# Patient Record
Sex: Female | Born: 1995 | Race: Black or African American | Hispanic: No | Marital: Single | Smoking: Never smoker
Health system: Southern US, Community
[De-identification: ages and names within clinical notes are randomized; demographics above are authoritative.]

## PROBLEM LIST (undated history)

## (undated) ENCOUNTER — Inpatient Hospital Stay (HOSPITAL_COMMUNITY): Payer: Self-pay

## (undated) DIAGNOSIS — O139 Gestational [pregnancy-induced] hypertension without significant proteinuria, unspecified trimester: Secondary | ICD-10-CM

---

## 2018-05-05 LAB — OB RESULTS CONSOLE GC/CHLAMYDIA
Chlamydia: NEGATIVE
Gonorrhea: NEGATIVE

## 2018-05-05 LAB — CYTOLOGY - PAP: PAP SMEAR: NEGATIVE

## 2018-05-23 LAB — OB RESULTS CONSOLE HEPATITIS B SURFACE ANTIGEN: HEP B S AG: NEGATIVE

## 2018-05-23 LAB — OB RESULTS CONSOLE RUBELLA ANTIBODY, IGM: RUBELLA: IMMUNE

## 2018-05-23 LAB — OB RESULTS CONSOLE HGB/HCT, BLOOD
HEMATOCRIT: 29
Hemoglobin: 9

## 2018-05-23 LAB — OB RESULTS CONSOLE ABO/RH: RH Type: POSITIVE

## 2018-05-23 LAB — OB RESULTS CONSOLE RPR: RPR: NONREACTIVE

## 2018-05-23 LAB — OB RESULTS CONSOLE PLATELET COUNT: Platelets: 435

## 2018-05-23 LAB — OB RESULTS CONSOLE ANTIBODY SCREEN: Antibody Screen: NEGATIVE

## 2018-05-23 LAB — CULTURE, OB URINE: Urine Culture, OB: POSITIVE

## 2018-05-23 LAB — OB RESULTS CONSOLE HIV ANTIBODY (ROUTINE TESTING): HIV: NONREACTIVE

## 2018-08-13 ENCOUNTER — Encounter: Payer: Self-pay | Admitting: *Deleted

## 2018-08-14 ENCOUNTER — Encounter: Payer: Self-pay | Admitting: *Deleted

## 2018-08-18 ENCOUNTER — Ambulatory Visit (INDEPENDENT_AMBULATORY_CARE_PROVIDER_SITE_OTHER): Payer: Medicaid Other | Admitting: Family Medicine

## 2018-08-18 ENCOUNTER — Ambulatory Visit: Payer: Medicaid Other | Admitting: Clinical

## 2018-08-18 ENCOUNTER — Encounter: Payer: Self-pay | Admitting: Family Medicine

## 2018-08-18 DIAGNOSIS — Z23 Encounter for immunization: Secondary | ICD-10-CM

## 2018-08-18 DIAGNOSIS — Z3401 Encounter for supervision of normal first pregnancy, first trimester: Secondary | ICD-10-CM | POA: Diagnosis not present

## 2018-08-18 DIAGNOSIS — Z3482 Encounter for supervision of other normal pregnancy, second trimester: Secondary | ICD-10-CM | POA: Diagnosis not present

## 2018-08-18 DIAGNOSIS — Z3403 Encounter for supervision of normal first pregnancy, third trimester: Secondary | ICD-10-CM | POA: Insufficient documentation

## 2018-08-18 MED ORDER — CONCEPT OB 130-92.4-1 MG PO CAPS: 1.0000 | ORAL_CAPSULE | Freq: Every day | ORAL | 3 refills | Status: DC

## 2018-08-18 MED ORDER — FERROUS SULFATE 325 (65 FE) MG PO TABS: ORAL_TABLET | ORAL | 2 refills | Status: DC

## 2018-08-18 NOTE — Patient Instructions (Signed)

## 2018-08-18 NOTE — BH Specialist Note (Signed)
Integrated Behavioral Health Initial Visit  MRN: [id number] Name: Natalie Holmes  Number of Integrated Behavioral Health Clinician visits:: 1/6 Session Start time: 4:13  Session End time: 4:23 Total time: 15 minutes  Type of Service: Integrated Behavioral Health- Individual/Family Interpretor:No. Interpretor Name and Language: n/a   Warm Hand Off Completed.       SUBJECTIVE: Natalie Holmes is a 22 y.o. female accompanied by n/a Patient was referred by Tinnie Gens, MD for Initial OB introduction to integrated behavioral health services . Patient reports the following symptoms/concerns: Pt states being unfamiliar with hospital as only concern.  Duration of problem: Today; Severity of problem: mild  OBJECTIVE: Mood: Normal and Affect: Appropriate Risk of harm to self or others: No plan to harm self or others  LIFE CONTEXT: Family and Social: Pt lives with FOB School/Work: - Self-Care: - Life Changes: Current pregnancy; move to [place] from [facility] about one month prior  GOALS ADDRESSED:n/a  INTERVENTIONS: Standardized Assessments completed: GAD-7 and PHQ 9  ASSESSMENT: Patient currently experiencing Supervision of normal first pregnancy in first trimester   Patient may benefit from Initial OB introduction to integrated behavioral health services .  PLAN: 1. Follow up with behavioral health clinician on : As needed 2. Behavioral recommendations:  -Continue taking prenatal vitamin; begin taking iron, as prescribed by medical provider -Watch [facility] Tour; share with FOB 3. Referral(s): Integrated [employer] (In Clinic) 4. "From scale of 1-10, how likely are you to follow plan?": 10  Rae Lips, LCSW  Depression screen [facility] 2/9 08/18/2018  Decreased Interest 0  Down, Depressed, Hopeless 0  PHQ - 2 Score 0  Altered sleeping 2  Tired, decreased energy 1  Change in appetite 0  Feeling bad or failure about yourself  0  Trouble concentrating  0  Moving slowly or fidgety/restless 0  Suicidal thoughts 0  PHQ-9 Score 3   GAD 7 : Generalized Anxiety Score 08/18/2018  Nervous, Anxious, on Edge 0  Control/stop worrying 0  Worry too much - different things 0  Trouble relaxing 0  Restless 0  Easily annoyed or irritable 0  Afraid - awful might happen 0  Total GAD 7 Score 0

## 2018-08-18 NOTE — Progress Notes (Signed)
.     PRENATAL VISIT NOTE  Subjective:  Natalie Holmes is a 22 y.o. G1P0 at [redacted]w[redacted]d being seen today for transferring prenatal care.  She is currently monitored for the following issues for this low-risk pregnancy and has Encounter for supervision of normal first pregnancy in first trimester on their problem list.  Patient reports no complaints. States legs fall asleep when on toilet if leans over. Contractions: Not present. Vag. Bleeding: None.  Movement: Present. Denies leaking of fluid.   The following portions of the patient's history were reviewed and updated as appropriate: allergies, current medications, past family history, past medical history, past social history, past surgical history and problem list. Problem list updated.  Objective:   Vitals:   08/18/18 1521 08/18/18 1524  BP: 124/78   Pulse: (!) 108   Weight: 131 lb 6.4 oz (59.6 kg)   Height:  5\' 1"  (1.549 m)    Fetal Status: Fetal Heart Rate (bpm): 154 Fundal Height: 22 cm Movement: Present     General:  Alert, oriented and cooperative. Patient is in no acute distress.  Skin: Skin is warm and dry. No rash noted.   Cardiovascular: Normal heart rate noted  Respiratory: Normal respiratory effort, no problems with respiration noted  Abdomen: Soft, gravid, appropriate for gestational age.  Pain/Pressure: Absent     Pelvic: Cervical exam deferred        Extremities: Normal range of motion.  Edema: None  Mental Status: Normal mood and affect. Normal behavior. Normal judgment and thought content.   Assessment and Plan:  Pregnancy: G1P0 at [redacted]w[redacted]d  1. Encounter for supervision of normal first pregnancy in first trimester Desires NIPT, flu shot today Needs anatomy u/s 28 wk labs next visit.  - Flu Vaccine QUAD 36+ mos IM - [place] MFM OB COMP + 14 WK; Future - ferrous sulfate 325 (65 FE) MG tablet; ferrous sulfate 325 mg (65 mg iron) tablet  Take 1 tablet twice a day by oral route for 30 days.  Dispense: 180 tablet; Refill: 2 -  Prenat w/o A Vit-FeFum-FePo-FA (CONCEPT OB) 130-92.4-1 MG CAPS; Take 1 capsule by mouth daily.  Dispense: 90 capsule; Refill: 3 - Genetic Screening  General obstetric precautions including but not limited to vaginal bleeding, contractions, leaking of fluid and fetal movement were reviewed in detail with the patient. Please refer to After Visit Summary for other counseling recommendations.  Return in 4 weeks (on 09/15/2018) for 28 wk labs.  Future Appointments  Date Time Provider Department Center  08/28/2018  7:45 AM WH-MFC [place] 2 WH-MFCUS MFC-US  09/15/2018  8:50 AM WOC-WOCA LAB WOC-[facility]A WOC  09/15/2018  9:15 AM Allie Bossier, MD WOC-[facility] WOC    Reva Bores, MD

## 2018-08-19 ENCOUNTER — Encounter: Payer: Self-pay | Admitting: *Deleted

## 2018-08-28 ENCOUNTER — Other Ambulatory Visit (HOSPITAL_COMMUNITY): Payer: Self-pay | Admitting: *Deleted

## 2018-08-28 ENCOUNTER — Ambulatory Visit (HOSPITAL_COMMUNITY)
Admission: RE | Admit: 2018-08-28 | Discharge: 2018-08-28 | Disposition: A | Discharge status: Home / Self Care | Payer: Medicaid Other | Source: Ambulatory Visit | Attending: Family Medicine | Admitting: Family Medicine

## 2018-08-28 DIAGNOSIS — Z3A25 25 weeks gestation of pregnancy: Secondary | ICD-10-CM | POA: Insufficient documentation

## 2018-08-28 DIAGNOSIS — Z363 Encounter for antenatal screening for malformations: Secondary | ICD-10-CM | POA: Insufficient documentation

## 2018-08-28 DIAGNOSIS — Z3401 Encounter for supervision of normal first pregnancy, first trimester: Secondary | ICD-10-CM

## 2018-08-28 DIAGNOSIS — Z362 Encounter for other antenatal screening follow-up: Secondary | ICD-10-CM

## 2018-09-01 ENCOUNTER — Encounter: Payer: Self-pay | Admitting: *Deleted

## 2018-09-12 ENCOUNTER — Other Ambulatory Visit: Payer: Self-pay | Admitting: General Practice

## 2018-09-12 DIAGNOSIS — Z3401 Encounter for supervision of normal first pregnancy, first trimester: Secondary | ICD-10-CM

## 2018-09-15 ENCOUNTER — Ambulatory Visit (INDEPENDENT_AMBULATORY_CARE_PROVIDER_SITE_OTHER): Payer: Medicaid Other | Admitting: Family Medicine

## 2018-09-15 ENCOUNTER — Other Ambulatory Visit: Payer: Medicaid Other

## 2018-09-15 VITALS — BP 114/72 | HR 97 | Wt 137.9 lb

## 2018-09-15 DIAGNOSIS — Z3401 Encounter for supervision of normal first pregnancy, first trimester: Secondary | ICD-10-CM | POA: Diagnosis not present

## 2018-09-15 DIAGNOSIS — Z23 Encounter for immunization: Secondary | ICD-10-CM

## 2018-09-15 DIAGNOSIS — Z3403 Encounter for supervision of normal first pregnancy, third trimester: Secondary | ICD-10-CM

## 2018-09-15 MED ORDER — FERROUS SULFATE 325 (65 FE) MG PO TABS: 325.0000 mg | ORAL_TABLET | Freq: Every day | ORAL | 1 refills | Status: DC

## 2018-09-16 LAB — CBC
HEMATOCRIT: 38.9 % (ref 34.0–46.6)
Hemoglobin: 12.5 g/dL (ref 11.1–15.9)
MCH: 27 pg (ref 26.6–33.0)
MCHC: 32.1 g/dL (ref 31.5–35.7)
MCV: 84 fL (ref 79–97)
Platelets: 284 10*3/uL (ref 150–450)
RBC: 4.63 x10E6/uL (ref 3.77–5.28)
RDW: 13.8 % (ref 12.3–15.4)
WBC: 11.9 10*3/uL — ABNORMAL HIGH (ref 3.4–10.8)

## 2018-09-16 LAB — GLUCOSE TOLERANCE, 2 HOURS W/ 1HR
Glucose, 1 hour: 120 mg/dL (ref 65–179)
Glucose, 2 hour: 98 mg/dL (ref 65–152)
Glucose, Fasting: 75 mg/dL (ref 65–91)

## 2018-09-16 LAB — HIV ANTIBODY (ROUTINE TESTING W REFLEX): HIV Screen 4th Generation wRfx: NONREACTIVE

## 2018-09-16 LAB — RPR: RPR Ser Ql: NONREACTIVE

## 2018-09-16 NOTE — Progress Notes (Signed)
   PRENATAL VISIT NOTE  Subjective:  Natalie Holmes is a 22 y.o. G1P0 at 6070w1d being seen today for ongoing prenatal care.  She is currently monitored for the following issues for this low-risk pregnancy and has Encounter for supervision of normal first pregnancy in third trimester on their problem list.  Patient reports no complaints.  Contractions: Not present. Vag. Bleeding: None.  Movement: Present. Denies leaking of fluid.   The following portions of the patient's history were reviewed and updated as appropriate: allergies, current medications, past family history, past medical history, past social history, past surgical history and problem list. Problem list updated.  Objective:   Vitals:   09/15/18 0907  BP: 114/72  Pulse: 97  Weight: 137 lb 14.4 oz (62.6 kg)    Fetal Status: Fetal Heart Rate (bpm): 156   Movement: Present     General:  Alert, oriented and cooperative. Patient is in no acute distress.  Skin: Skin is warm and dry. No rash noted.   Cardiovascular: Normal heart rate noted  Respiratory: Normal respiratory effort, no problems with respiration noted  Abdomen: Soft, gravid, appropriate for gestational age.  Pain/Pressure: Absent     Pelvic: Cervical exam deferred        Extremities: Normal range of motion.  Edema: None  Mental Status: Normal mood and affect. Normal behavior. Normal judgment and thought content.   Assessment and Plan:  Pregnancy: G1P0 at 7870w1d  1. Need for Tdap vaccination - Tdap vaccine greater than or equal to 7yo IM  2. Encounter for supervision of normal first pregnancy in third trimester - ferrous sulfate 325 (65 FE) MG tablet; Take 1 tablet (325 mg total) by mouth daily. ferrous sulfate 325 mg (65 mg iron) tablet  Take 1 tablet twice a day by oral route for 30 days.  Dispense: 90 tablet; Refill: 1  Preterm labor symptoms and general obstetric precautions including but not limited to vaginal bleeding, contractions, leaking of fluid and fetal  movement were reviewed in detail with the patient. Please refer to After Visit Summary for other counseling recommendations.  Return in about 2 weeks (around 09/29/2018) for ROB.  Future Appointments  Date Time Provider Department Center  09/25/2018 10:45 AM WH-MFC US 2 WH-MFCUS MFC-US  09/29/2018  9:35 AM Armando ReichertHogan, Heather D, CNM WOC-WOCA WOC  10/13/2018 10:55 AM Armando ReichertHogan, Heather D, CNM WOC-WOCA WOC  10/27/2018  8:55 AM Armando ReichertHogan, Heather D, CNM WOC-WOCA WOC    Gwenevere AbbotNimeka Nike Southers, MD

## 2018-09-25 ENCOUNTER — Encounter (HOSPITAL_COMMUNITY): Payer: Self-pay

## 2018-09-25 ENCOUNTER — Ambulatory Visit (HOSPITAL_COMMUNITY)
Admission: RE | Admit: 2018-09-25 | Discharge: 2018-09-25 | Disposition: A | Discharge status: Home / Self Care | Payer: Medicaid Other | Source: Ambulatory Visit | Attending: Family Medicine | Admitting: Family Medicine

## 2018-09-25 DIAGNOSIS — Z3687 Encounter for antenatal screening for uncertain dates: Secondary | ICD-10-CM | POA: Diagnosis not present

## 2018-09-25 DIAGNOSIS — Z3A29 29 weeks gestation of pregnancy: Secondary | ICD-10-CM

## 2018-09-25 DIAGNOSIS — Z362 Encounter for other antenatal screening follow-up: Secondary | ICD-10-CM | POA: Diagnosis not present

## 2018-09-25 IMAGING — US US MFM OB FOLLOW-UP
1 series · 13 of 28 positions shown · non-contrast
Comparison: none

[Series 1: us mfm ob follow-up · 54 acquisitions, 13 frames shown]
[im 2/54]
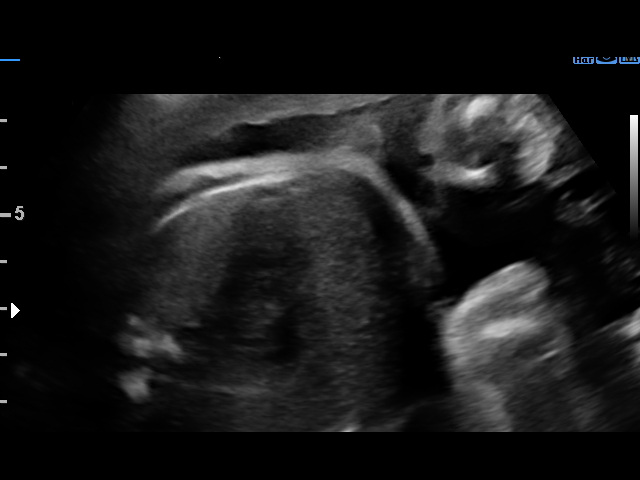
[im 6/54]
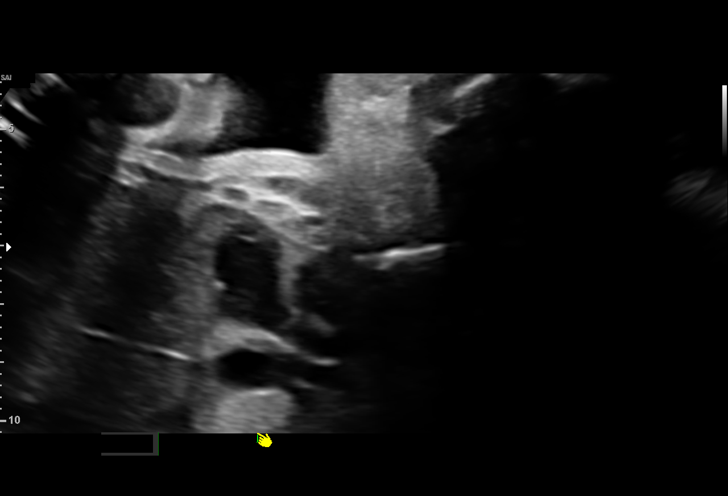
[im 10/54]
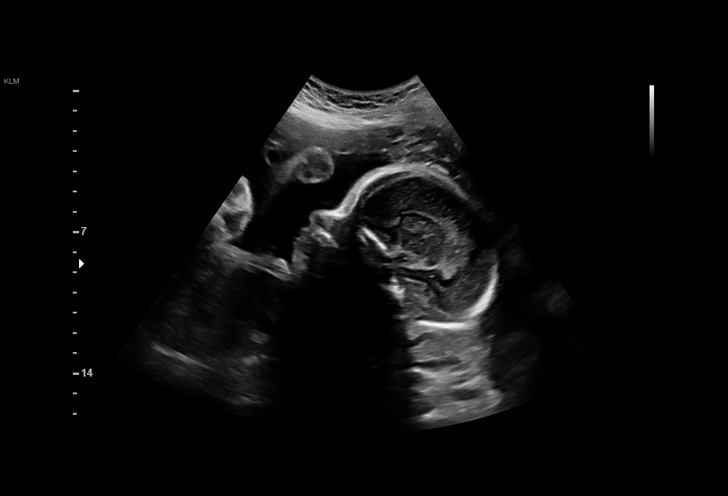
[im 14/54]
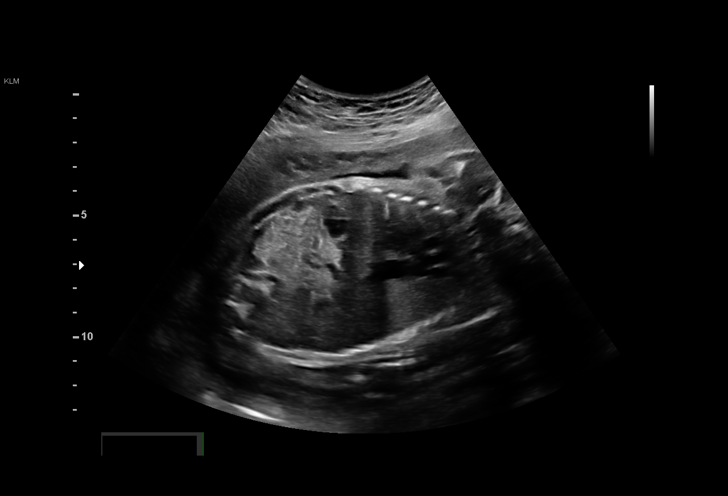
[im 18/54]
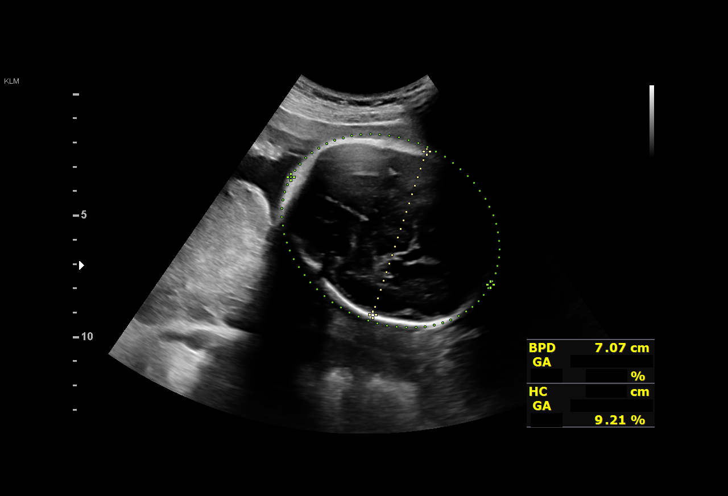
[im 22/54]
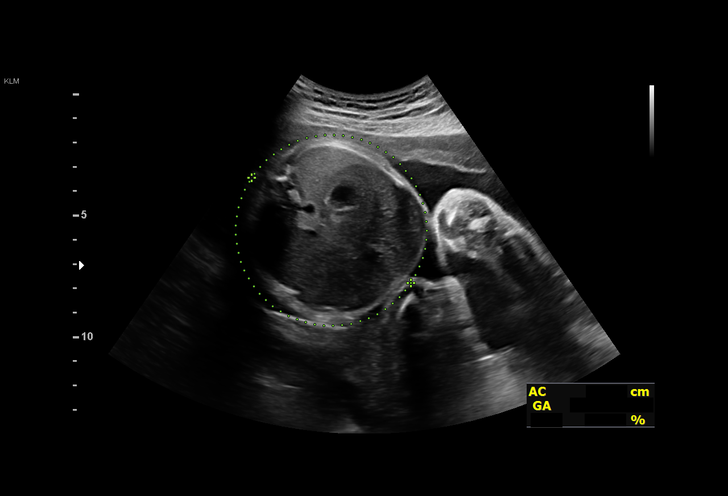
[im 28/54]
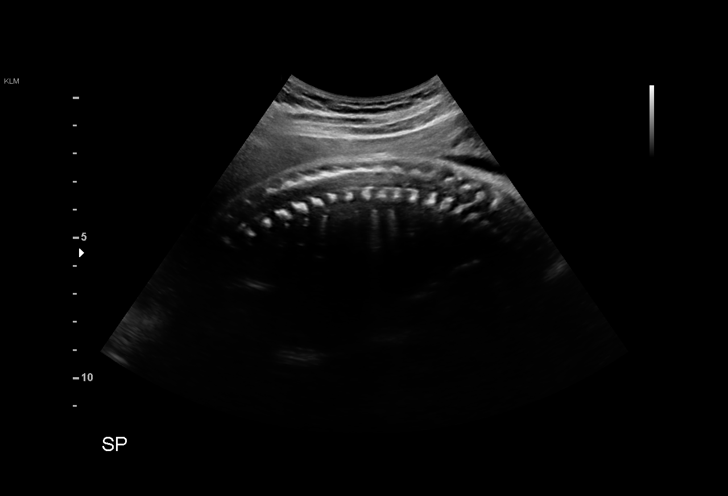
[im 32/54]
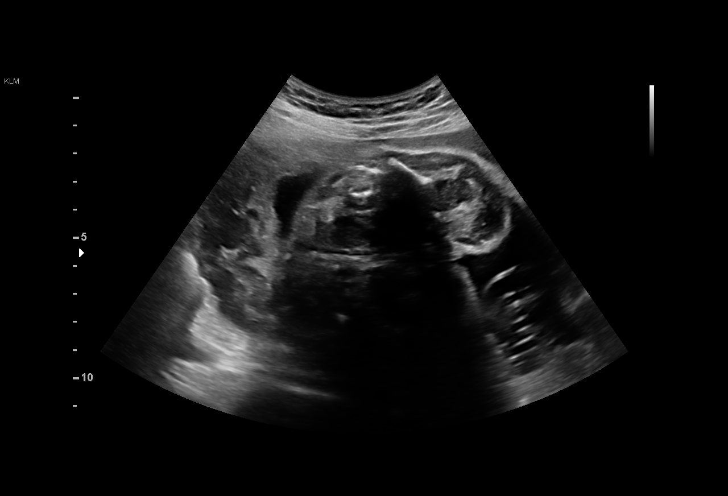
[im 36/54]
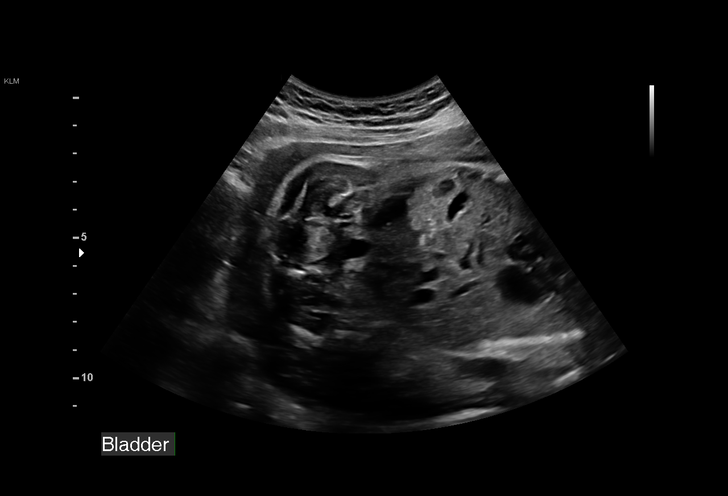
[im 40/54]
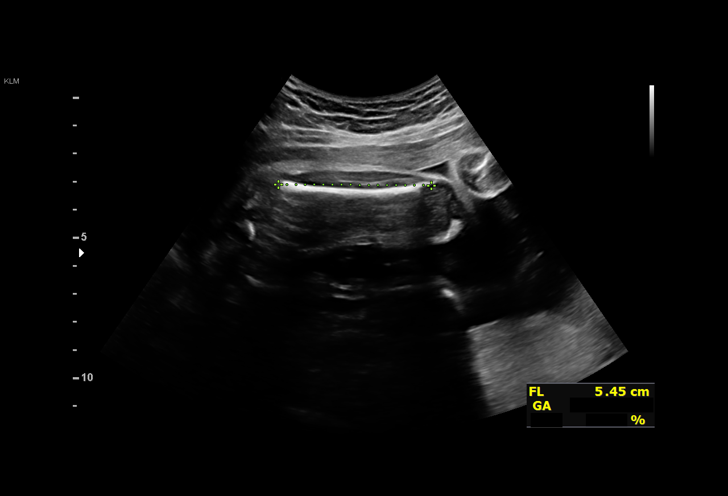
[im 44/54]
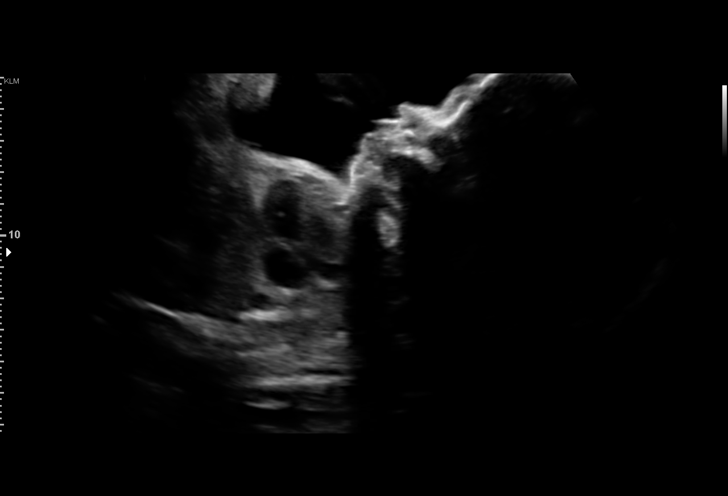
[im 48/54]
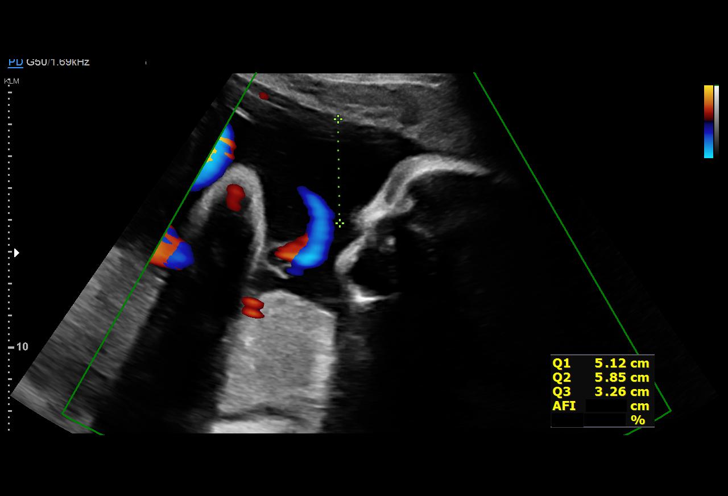
[im 52/54]
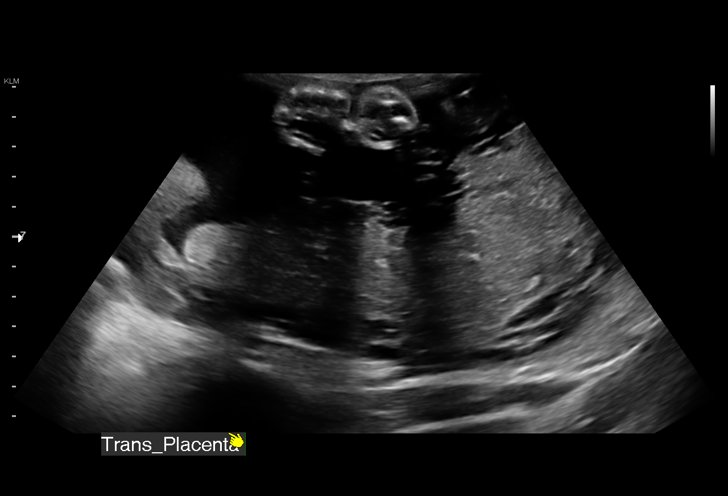

[13 of 28 positions shown; findings below may reference images not displayed]

----------------------------------------------------------------------

 ----------------------------------------------------------------------
Indications

  Antenatal follow-up for nonvisualized fetal    [KB]
  anatomy
  29 weeks gestation of pregnancy
  Encounter for uncertain dates                  [KB]
 ----------------------------------------------------------------------
Fetal Evaluation

 Num Of Fetuses:         1
 Fetal Heart Rate(bpm):  146
 Cardiac Activity:       Observed
 Presentation:           Cephalic
 Placenta:               Posterior
 P. Cord Insertion:      Visualized

 Amniotic Fluid
 AFI FV:      Within normal limits

 AFI Sum(cm)     %Tile       Largest Pocket(cm)
 17.67           66

 RUQ(cm)       RLQ(cm)       LUQ(cm)        LLQ(cm)

Biometry

 BPD:        71  mm     G. Age:  28w 4d         14  %    CI:        73.37   %    70 - 86
                                                         FL/HC:      20.7   %    19.6 -
 HC:      263.4  mm     G. Age:  28w 4d          6  %    HC/AC:      1.07        0.99 -
 AC:      246.7  mm     G. Age:  28w 6d         29  %    FL/BPD:     76.8   %    71 - 87
 FL:       54.5  mm     G. Age:  28w 6d         20  %    FL/AC:      22.1   %    20 - 24

 Est. FW:    [KB]  gm    2 lb 13 oz      40  %
OB History

 Gravidity:    1
Gestational Age

 LMP:           29w 3d        Date:  [DATE]                 EDD:   [DATE]
 U/S Today:     28w 5d                                        EDD:   [DATE]
 Best:          29w 3d     Det. By:  LMP  ([DATE])          EDD:   [DATE]
Anatomy

 Cranium:               Appears normal         Aortic Arch:            Appears normal
 Cavum:                 Appears normal         Ductal Arch:            Appears normal
 Ventricles:            Appears normal         Diaphragm:              Appears normal
 Choroid Plexus:        Appears normal         Stomach:                Appears normal, left
                                                                       sided
 Cerebellum:            Previously seen        Abdomen:                Appears normal
 Posterior Fossa:       Previously seen        Abdominal Wall:         Previously seen
 Nuchal Fold:           Not applicable (>20    Cord Vessels:           Previously seen
                        wks GA)
 Face:                  Orbits and profile     Kidneys:                Appear normal
                        previously seen
 Lips:                  Appears normal         Bladder:                Appears normal
 Thoracic:              Appears normal         Spine:                  Appears normal
 Heart:                 Echogenic focus        Upper Extremities:      Previously seen
                        in LV
 RVOT:                  Appears normal         Lower Extremities:      Previously seen
 LVOT:                  Appears normal

 Other:  Open hands previously visualized. Fetus appears to be female.
         Technically difficult due to fetal position.
Cervix Uterus Adnexa

 Cervix
 Not visualized (advanced GA >[KB])
Comments

 U/S images reviewed. Findings reviewed with patient.
 Appropriate fetal growth is noted.   An echogenic intra-
 cardiac focus was seen.  No other fetal abnormalities are
 seen.  Questions answered.
 General counseling was performed regarding echogenic
 intracardiac focus/foci (EIF) was performed.  EIF is seen in 3-
 4% of normal fetuses in the second trimester.  The
 prevalence appears to be significantly higher in Asian
 populations.  The likelihood ratio of EIF for Trisomy 21 is
 estimated in the range of 1.8 - 4.2 (times the age related
 risk).  Multiple or large EIF may further increase the risk of
 aneuploidy.   Patient's age related risk for Trisomy 21 with an
 EIF is 1/568.  Patient had a low risk NIPT reducing her risk to
 less than [DATE],000.

 15 minutes spent face to face with patient.
 Recommendations: 1) Follow-up as clinically indicated
Recommendations

 Follow-up as clinically indicated
              ESTONIA

## 2018-09-29 ENCOUNTER — Encounter (HOSPITAL_COMMUNITY): Payer: Self-pay

## 2018-09-29 ENCOUNTER — Encounter: Payer: Self-pay | Admitting: Advanced Practice Midwife

## 2018-09-29 ENCOUNTER — Other Ambulatory Visit: Payer: Self-pay

## 2018-09-29 ENCOUNTER — Ambulatory Visit (INDEPENDENT_AMBULATORY_CARE_PROVIDER_SITE_OTHER): Payer: Medicaid Other | Admitting: Advanced Practice Midwife

## 2018-09-29 ENCOUNTER — Inpatient Hospital Stay (HOSPITAL_COMMUNITY)
Admission: AD | Admit: 2018-09-29 | Discharge: 2018-09-29 | Disposition: A | Discharge status: Home / Self Care | Payer: Medicaid Other | Source: Ambulatory Visit | Attending: Obstetrics & Gynecology | Admitting: Obstetrics & Gynecology

## 2018-09-29 VITALS — BP 123/74 | HR 118 | Wt 142.0 lb

## 2018-09-29 DIAGNOSIS — O4693 Antepartum hemorrhage, unspecified, third trimester: Secondary | ICD-10-CM | POA: Diagnosis not present

## 2018-09-29 DIAGNOSIS — N930 Postcoital and contact bleeding: Secondary | ICD-10-CM

## 2018-09-29 DIAGNOSIS — O2313 Infections of bladder in pregnancy, third trimester: Secondary | ICD-10-CM | POA: Diagnosis not present

## 2018-09-29 DIAGNOSIS — B373 Candidiasis of vulva and vagina: Secondary | ICD-10-CM

## 2018-09-29 DIAGNOSIS — O98813 Other maternal infectious and parasitic diseases complicating pregnancy, third trimester: Secondary | ICD-10-CM

## 2018-09-29 DIAGNOSIS — Z79899 Other long term (current) drug therapy: Secondary | ICD-10-CM | POA: Insufficient documentation

## 2018-09-29 DIAGNOSIS — O26893 Other specified pregnancy related conditions, third trimester: Secondary | ICD-10-CM | POA: Diagnosis not present

## 2018-09-29 DIAGNOSIS — O98819 Other maternal infectious and parasitic diseases complicating pregnancy, unspecified trimester: Secondary | ICD-10-CM

## 2018-09-29 DIAGNOSIS — Z880 Allergy status to penicillin: Secondary | ICD-10-CM | POA: Diagnosis not present

## 2018-09-29 DIAGNOSIS — Z3A30 30 weeks gestation of pregnancy: Secondary | ICD-10-CM

## 2018-09-29 DIAGNOSIS — N3000 Acute cystitis without hematuria: Secondary | ICD-10-CM | POA: Diagnosis not present

## 2018-09-29 DIAGNOSIS — B9689 Other specified bacterial agents as the cause of diseases classified elsewhere: Secondary | ICD-10-CM | POA: Insufficient documentation

## 2018-09-29 DIAGNOSIS — Z3403 Encounter for supervision of normal first pregnancy, third trimester: Secondary | ICD-10-CM

## 2018-09-29 DIAGNOSIS — B3731 Acute candidiasis of vulva and vagina: Secondary | ICD-10-CM

## 2018-09-29 LAB — URINALYSIS, ROUTINE W REFLEX MICROSCOPIC
BILIRUBIN URINE: NEGATIVE
GLUCOSE, UA: NEGATIVE mg/dL
Ketones, ur: NEGATIVE mg/dL
NITRITE: POSITIVE — AB
PROTEIN: NEGATIVE mg/dL
Specific Gravity, Urine: 1.006 (ref 1.005–1.030)
pH: 6 (ref 5.0–8.0)

## 2018-09-29 MED ORDER — NITROFURANTOIN MONOHYD MACRO 100 MG PO CAPS: 100.0000 mg | ORAL_CAPSULE | Freq: Two times a day (BID) | ORAL | 0 refills | Status: DC

## 2018-09-29 MED ORDER — TERCONAZOLE 0.4 % VA CREA: 1.0000 | TOPICAL_CREAM | Freq: Every day | VAGINAL | 0 refills | Status: DC

## 2018-09-29 NOTE — MAU Provider Note (Signed)
History     CSN: [id number]  Arrival date and time: 09/29/18 7253   First Provider Initiated Contact with Patient 09/29/18 0548      Chief Complaint  Patient presents with  . Vaginal Bleeding   HPI Natalie Holmes is a 22 y.o. G1P0 at [redacted]w[redacted]d who presents with vaginal bleeding after intercourse tonight. She states the bleeding continued longer than she expected and it more than just spotting. She denies any pain. Denies any leaking of fluid. Reports normal fetal movement.   OB History    Gravida  1   Para      Term      Preterm      AB      Living        SAB      TAB      Ectopic      Multiple      Live Births              History reviewed. No pertinent past medical history.  History reviewed. No pertinent surgical history.  Family History  Problem Relation Age of Onset  . Healthy Mother   . Healthy Father     Social History   Tobacco Use  . Smoking status: Never Smoker  . Smokeless tobacco: Never Used  Substance Use Topics  . Alcohol use: Not Currently    Alcohol/week: 1.0 standard drinks    Types: 1 Glasses of wine per week    Frequency: Never  . Drug use: Never    Allergies:  Allergies  Allergen Reactions  . Penicillins Hives    Medications Prior to Admission  Medication Sig Dispense Refill Last Dose  . ferrous sulfate 325 (65 FE) MG tablet Take 1 tablet (325 mg total) by mouth daily. ferrous sulfate 325 mg (65 mg iron) tablet  Take 1 tablet twice a day by oral route for 30 days. 90 tablet 1 Past Month at Unknown time  . Prenat w/o A Vit-FeFum-FePo-FA (CONCEPT OB) 130-92.4-1 MG CAPS Take 1 capsule by mouth daily. 90 capsule 3 09/28/2018 at Unknown time    Review of Systems  Constitutional: Negative.  Negative for fatigue and fever.  HENT: Negative.   Respiratory: Negative.  Negative for shortness of breath.   Cardiovascular: Negative.  Negative for chest pain.  Gastrointestinal: Negative.  Negative for abdominal pain, constipation,  diarrhea, nausea and vomiting.  Genitourinary: Positive for vaginal bleeding. Negative for dysuria.  Neurological: Negative.  Negative for dizziness and headaches.   Physical Exam   Last menstrual period 03/03/2018.  Physical Exam  Nursing note and vitals reviewed. Constitutional: She is oriented to person, place, and time. She appears well-developed and well-nourished. No distress.  HENT:  Head: Normocephalic.  Eyes: Pupils are equal, round, and reactive to light.  Cardiovascular: Normal rate, regular rhythm and normal heart sounds.  Respiratory: Effort normal and breath sounds normal. No respiratory distress.  GI: Soft. Bowel sounds are normal. She exhibits no distension. There is no tenderness.  Genitourinary:  Genitourinary Comments: No blood in vault. Small amount of brown discharge and thick white adherent discharge on vaginal walls. No blood noted   Neurological: She is alert and oriented to person, place, and time.  Skin: Skin is warm and dry.  Psychiatric: She has a normal mood and affect. Her behavior is normal. Judgment and thought content normal.   Dilation: Closed Exam by:: Cleone Slim, CNM  Fetal Tracing:  Baseline: 130 Variability: moderate Accels: 15x15 Decels: none  Toco:  ui with occasional uc's   MAU Course  Procedures Results for orders placed or performed during the hospital encounter of 09/29/18 (from the past 24 hour(s))  Urinalysis, Routine w reflex microscopic     Status: Abnormal   Collection Time: 09/29/18  5:23 AM  Result Value Ref Range   Color, Urine YELLOW YELLOW   APPearance CLOUDY (A) CLEAR   Specific Gravity, Urine 1.006 1.005 - 1.030   pH 6.0 5.0 - 8.0   Glucose, UA NEGATIVE NEGATIVE mg/dL   Hgb urine dipstick LARGE (A) NEGATIVE   Bilirubin Urine NEGATIVE NEGATIVE   Ketones, ur NEGATIVE NEGATIVE mg/dL   Protein, ur NEGATIVE NEGATIVE mg/dL   Nitrite POSITIVE (A) NEGATIVE   Leukocytes, UA LARGE (A) NEGATIVE   RBC / HPF 21-50 0 -  5 RBC/hpf   WBC, UA >50 (H) 0 - 5 WBC/hpf   Bacteria, UA MANY (A) NONE SEEN   Squamous Epithelial / LPF 11-20 0 - 5   Mucus PRESENT    MDM UA PO hydration Unable to do FFN due to recent intercourse Patient denies any pain and does not feel ui or uc's. Cervix closed. No sign of preterm labor at this time. Contractions resolved with PO hydration  Assessment and Plan   1. PCB (post coital bleeding)   2. Acute cystitis during pregnancy in third trimester   3. Candidiasis of vagina during pregnancy   4. [redacted] weeks gestation of pregnancy    -Discharge home in stable condition -Rx for macrobid and terazol sent to patient's pharmacy -Vaginal bleeding and pelvic rest precautions discussed -[facility] -Patient may return to MAU as needed or if her condition were to change or worsen  Rolm BookbinderCaroline M Avaeh Ewer CNM 09/29/2018, 5:48 AM

## 2018-09-29 NOTE — Patient Instructions (Signed)
AREA PEDIATRIC/FAMILY [place] [address] [place], [place]  [address] Phone - [phone]   Fax - [phone]  [facility] [address] [place], [place] [address] Phone - [phone]   Fax - [place] [address] [place], [place]  [address] Phone - [phone]   Fax - [phone]  [place]. [address] [place] [address] [place], [place]  [address] Phone - [phone]   Fax - [phone]  [place] [address] [place], [place]  [address] Phone - [phone]   Fax - [phone]  [facility] [address], [address] [place], [place]  [address] Phone - [phone]   Fax - [place] [address] [place], [place]  [address] Phone - [phone]   Fax - [phone]  [place] [address] [place], [place] [address] [place], [place]  [address] Phone - [phone]   Fax - [place] [address] [place], [place]  [address] Phone - [phone]   Fax - [phone] [facility] [place]. [address] [place], [place]  [address] Phone - [phone]   Fax - [phone]  [facility] [place] [address] [place], [place]  [address] Phone - [phone]   Fax - [phone]  [facility] AT [place], [place], [place]  [address] Phone - [phone]   Fax - [place] [address], [place], [place]  [address] Phone - [phone]   Fax - [phone]  [facility] [address], [place], [place]  [address] Phone - [place], [place]  [address] Phone - [phone]   Fax - [place] [address] [place], [place]  [address] Phone - [phone]   Fax - [phone]  [place] [address] [place], [place]  [address] Phone - [phone]   Fax - [place]. [place], [place]  [address] Phone - [phone]   Fax - [place], [place], [place]  [address] Phone - [phone]   Fax - [place] [address] [place], [place]  [address] Phone - [phone]   Fax - [phone]  [facility] RUBIN [address] [place], [place]  [address] Phone - [phone]   Fax - [place], [place], [place]  [address] Phone - [phone]   Fax - [phone]  [place] [address] [place], [place]  [address] Phone - [phone]   Fax - [phone] [place] [place] [address] [place], [place]  [address] Phone - [phone]   Fax - [place] [address] [place], [place]  [address] Phone - [phone]   Fax - [place] [address], [place], [place]  [address] Phone - [phone]   Fax - [phone]  Kerrick MD [address] [place] [address] Phone [phone]  Fax [phone]   Contraception Choices Contraception, also called birth control, refers to methods or devices that prevent  pregnancy. Hormonal methods Contraceptive implant A contraceptive implant is a thin, plastic tube that contains a hormone. It is inserted into the upper part of the arm. It can remain in place for up to 3 years. Progestin-only injections Progestin-only injections are injections of progestin, a synthetic form of the hormone progesterone. They are given every 3 months by a health care  provider. Birth control pills Birth control pills are pills that contain hormones that prevent pregnancy. They must be taken once a day, preferably at the same time each day. Birth control patch The birth control patch contains hormones that prevent pregnancy. It is placed on the skin and must be changed once a week for three weeks and removed on the fourth week. A prescription is needed to use this method of contraception. Vaginal ring A vaginal ring contains hormones that prevent pregnancy. It is placed in the vagina for three weeks and removed on the fourth week. After that, the process is repeated with a new ring. A prescription is needed to use this method of contraception. Emergency contraceptive Emergency contraceptives prevent pregnancy after unprotected sex. They come in pill form and can be taken up to 5 days after sex. They work best the sooner they are taken after having sex. Most emergency contraceptives are available without a prescription. This method should not be used as your only form of birth control. Barrier methods Female condom A female condom is a thin sheath that is worn over the penis during sex. Condoms keep sperm from going inside a woman's body. They can be used with a spermicide to increase their effectiveness. They should be disposed after a single use. Female condom A female condom is a soft, loose-fitting sheath that is put into the vagina before sex. The condom keeps sperm from going inside a woman's body. They should be disposed after a single use. Diaphragm A diaphragm is a soft, dome-shaped barrier. It is inserted into the vagina before sex, along with a spermicide. The diaphragm blocks sperm from entering the uterus, and the spermicide kills sperm. A diaphragm should be left in the vagina for 6-8 hours after sex and removed within 24 hours. A diaphragm is prescribed and fitted by a health care provider. A diaphragm should be replaced every 1-2 years, after giving  birth, after gaining more than 15 lb (6.8 kg), and after pelvic surgery. Cervical cap A cervical cap is a round, soft latex or plastic cup that fits over the cervix. It is inserted into the vagina before sex, along with spermicide. It blocks sperm from entering the uterus. The cap should be left in place for 6-8 hours after sex and removed within 48 hours. A cervical cap must be prescribed and fitted by a health care provider. It should be replaced every 2 years. Sponge A sponge is a soft, circular piece of polyurethane foam with spermicide on it. The sponge helps block sperm from entering the uterus, and the spermicide kills sperm. To use it, you make it wet and then insert it into the vagina. It should be inserted before sex, left in for at least 6 hours after sex, and removed and thrown away within 30 hours. Spermicides Spermicides are chemicals that kill or block sperm from entering the cervix and uterus. They can come as a cream, jelly, suppository, foam, or tablet. A spermicide should be inserted into the vagina with an applicator at least 24-58 minutes before sex to allow time for it to work. The process must be repeated  every time you have sex. Spermicides do not require a prescription. Intrauterine contraception Intrauterine device (IUD) An IUD is a T-shaped device that is put in a woman's uterus. There are two types:  Hormone IUD.This type contains progestin, a synthetic form of the hormone progesterone. This type can stay in place for 3-5 years.  Copper IUD.This type is wrapped in copper wire. It can stay in place for 10 years.  Permanent methods of contraception Female tubal ligation In this method, a woman's fallopian tubes are sealed, tied, or blocked during surgery to prevent eggs from traveling to the uterus. Hysteroscopic sterilization In this method, a small, flexible insert is placed into each fallopian tube. The inserts cause scar tissue to form in the fallopian tubes and block  them, so sperm cannot reach an egg. The procedure takes about 3 months to be effective. Another form of birth control must be used during those 3 months. Female sterilization This is a procedure to tie off the tubes that carry sperm (vasectomy). After the procedure, the man can still ejaculate fluid (semen). Natural planning methods Natural family planning In this method, a couple does not have sex on days when the woman could become pregnant. Calendar method This means keeping track of the length of each menstrual cycle, identifying the days when pregnancy can happen, and not having sex on those days. Ovulation method In this method, a couple avoids sex during ovulation. Symptothermal method This method involves not having sex during ovulation. The woman typically checks for ovulation by watching changes in her temperature and in the consistency of cervical mucus. Post-ovulation method In this method, a couple waits to have sex until after ovulation. Summary  Contraception, also called birth control, means methods or devices that prevent pregnancy.  Hormonal methods of contraception include implants, injections, pills, patches, vaginal rings, and emergency contraceptives.  Barrier methods of contraception can include female condoms, female condoms, diaphragms, cervical caps, sponges, and spermicides.  There are two types of IUDs (intrauterine devices). An IUD can be put in a woman's uterus to prevent pregnancy for 3-5 years.  Permanent sterilization can be done through a procedure for males, females, or both.  Natural family planning methods involve not having sex on days when the woman could become pregnant. This information is not intended to replace advice given to you by your health care provider. Make sure you discuss any questions you have with your health care provider. Document Released: 10/22/2005 Document Revised: 11/24/2016 Document Reviewed: 11/24/2016 Elsevier Interactive  Patient Education  2018 [place] Education Options: [facility] Department Classes:  Childbirth education classes can help you get ready for a positive parenting experience. You can also meet other expectant parents and get free stuff for your baby. Each class runs for five weeks on the same night and costs $45 for the mother-to-be and her support person. Medicaid covers the cost if you are eligible. Call [phone] to register. [facility] Childbirth Education:  [phone] or [phone] or [email or web address]  Baby & Me Class: Discuss newborn & infant parenting and family adjustment issues with other new mothers in a relaxed environment. Each week brings a new speaker or baby-centered activity. We encourage new mothers to join [place] every Thursday at 11:00am. Babies birth until crawling. No registration or fee. Daddy [employer]: This course offers Dads-to-be the tools and knowledge needed to feel confident on their journey to becoming new fathers. Experienced dads, who have been trained as coaches, teach dads-to-be how to hold,  comfort, diaper, swaddle and play with their infant while being able to support the new mom as well. A class for men taught by men. $25/dad Big Brother/Big Sister: Let your children share in the joy of a new brother or sister in this special class designed just for them. Class includes discussion about how families care for babies: swaddling, holding, diapering, safety as well as how they can be helpful in their new role. This class is designed for children ages 35 to 51, but any age is welcome. Please register each child individually. $5/child  Mom Talk: This mom-led group offers support and connection to mothers as they journey through the adjustments and struggles of that sometimes overwhelming first year after the birth of a child. Tuesdays at 10:00am and Thursdays at 6:00pm. Babies welcome. No registration or fee. Breastfeeding Support  Group: This group is a mother-to-mother support circle where moms have the opportunity to share their breastfeeding experiences. A Lactation Consultant is present for questions and concerns. Meets each Tuesday at 11:00am. No fee or registration. Breastfeeding Your Baby: Learn what to expect in the first days of breastfeeding your newborn.  This class will help you feel more confident with the skills needed to begin your breastfeeding experience. Many new mothers are concerned about breastfeeding after leaving the hospital. This class will also address the most common fears and challenges about breastfeeding during the first few weeks, months and beyond. (call for fee) Comfort Techniques and Tour: This 2 hour interactive class will provide you the opportunity to learn & practice hands-on techniques that can help relieve some of the discomfort of labor and encourage your baby to rotate toward the best position for birth. You and your partner will be able to try a variety of labor positions with birth balls and rebozos as well as practice breathing, relaxation, and visualization techniques. A tour of the [facility] is included with this class. $20 per registrant and support person Childbirth Class- Weekend Option: This class is a Weekend version of our Birth & Baby series. It is designed for parents who have a difficult time fitting several weeks of classes into their schedule. It covers the care of your newborn and the basics of labor and childbirth. It also includes a [facility] and lunch. The class is held two consecutive days: beginning on Friday evening from 6:30 - 8:30 p.m. and the next day, Saturday from 9 a.m. - 4 p.m. (call for fee) Doren Custard Class: Interested in a waterbirth?  This informational class will help you discover whether waterbirth is the right fit for you. Education about waterbirth itself, supplies you would need and how to  assemble your support team is what you can expect from this class. Some obstetrical practices require this class in order to pursue a waterbirth. (Not all obstetrical practices offer waterbirth-check with your healthcare provider.) Register only the expectant mom, but you are encouraged to bring your partner to class! Required if planning waterbirth, no fee. Infant/Child CPR: Parents, grandparents, babysitters, and friends learn Cardio-Pulmonary Resuscitation skills for infants and children. You will also learn how to treat both conscious and unconscious choking in infants and children. This Family & Friends program does not offer certification. Register each participant individually to ensure that enough mannequins are available. (Call for fee) Grandparent Love: Expecting a grandbaby? This class is for you! Learn about the latest infant care and safety recommendations and ways to support your own child as he  or she transitions into the parenting role. Taught by Registered Nurses who are childbirth instructors, but most importantly...they are grandmothers too! $10/person. Childbirth Class- Natural Childbirth: This series of 5 weekly classes is for expectant parents who want to learn and practice natural methods of coping with the process of labor and childbirth. Relaxation, breathing, massage, visualization, role of the partner, and helpful positioning are highlighted. Participants learn how to be confident in their body's ability to give birth. This class will empower and help parents make informed decisions about their own care. Includes discussion that will help new parents transition into the immediate postpartum period. [facility] is included. We suggest taking this class between 25-32 weeks, but it's only a recommendation. $75 per registrant and one support person or $30 Medicaid. Childbirth Class- 3 week Series: This option of 3 weekly classes helps you and your labor  partner prepare for childbirth. Newborn care, labor & birth, cesarean birth, pain management, and comfort techniques are discussed and a [place] [facility] is included. The class meets at the same time, on the same day of the week for 3 consecutive weeks beginning with the starting date you choose. $60 for registrant and one support person.  Marvelous Multiples: Expecting twins, triplets, or more? This class covers the differences in labor, birth, parenting, and breastfeeding issues that face multiples' parents. NICU tour is included. Led by a Certified Childbirth Educator who is the mother of twins. No fee. Caring for Baby: This class is for expectant and adoptive parents who want to learn and practice the most up-to-date newborn care for their babies. Focus is on birth through the first six weeks of life. Topics include feeding, bathing, diapering, crying, umbilical cord care, circumcision care and safe sleep. Parents learn to recognize symptoms of illness and when to call the pediatrician. Register only the mom-to-be and your partner or support person can plan to come with you! $10 per registrant and support person Childbirth Class- online option: This online class offers you the freedom to complete a Birth and Baby series in the comfort of your own home. The flexibility of this option allows you to review sections at your own pace, at times convenient to you and your support people. It includes additional video information, animations, quizzes, and extended activities. Get organized with helpful eClass tools, checklists, and trackers. Once you register online for the class, you will receive an email within a few days to accept the invitation and begin the class when the time is right for you. The content will be available to you for 60 days. $60 for 60 days of online access for you and your support people.  Local Doulas: Natural Baby  Doulas [email or web address] Tel: [phone] [email or web address] [employer] [phone] [email or web address] The Labor Hassell Halim  (also do waterbirth tub rental) [phone] [email or web address] Triad Birth Doula [phone] [email or web address] [email or web address].fi Sacred Rhythms  [phone] https://[email or web address]-[email or web address]/ [employer] (PADA) [email or web address] https://www.frey.[email or web address]/ [place] Birth and Baby  [email or web address]/ Considering Waterbirth? Guide for patients at [facility] [employer]  Why consider waterbirth?  . Gentle birth for babies . Less pain medicine used in labor . May allow for passive descent/less pushing . May reduce perineal tears  . More mobility and instinctive maternal position changes . Increased maternal relaxation . Reduced blood pressure in labor  Is waterbirth safe? What are the risks of infection, drowning or other complications?  . Infection: o Very low risk (3.7 % for tub  vs 4.8% for bed) o 7 in 8000 waterbirths with documented infection o Poorly cleaned equipment most common cause o Slightly lower group B strep transmission rate  . Drowning o Maternal:  - Very low risk   - Related to seizures or fainting o Newborn:  - Very low risk. No evidence of increased risk of respiratory problems in multiple large studies - Physiological protection from breathing under water - Avoid underwater birth if there are any fetal complications - Once baby's head is out of the water, keep it out.  . Birth complication o Some reports of cord trauma, but risk decreased by bringing baby to surface gradually o No evidence of increased risk of shoulder dystocia. Mothers can usually change positions faster in water than in a bed, possibly aiding the maneuvers to free the shoulder.   You  must attend a Doren Custard class at [facility]  3rd Wednesday of every month from 7-9pm  [employer] by calling [phone] or online at [email or web address]  Bring [place] the certificate from the class to your prenatal appointment  Meet with a midwife at 36 weeks to see if you can still plan a waterbirth and to sign the consent.   Purchase or rent the following supplies:   Water Birth Pool (Birth Pool in a Box or [place] for instance)  (Tubs start ~$125)  Single-use disposable tub liner designed for your brand of tub  New garden hose labeled "lead-free", "suitable for drinking water",  Electric drain pump to remove water (We recommend 792 gallon per hour or greater pump.)   Separate garden hose to remove the dirty water  Fish net  Bathing suit top (optional)  Long-handled mirror (optional)  Places to purchase or rent supplies  GotWebTools.is for tub purchases and supplies  Waterbirthsolutions.com for tub purchases and supplies  The Labor Ladies (www.[email or web address]) $275 for tub rental/set-up & take down/kit   [employer] (http://www.fleming.com/.htm) Information regarding doulas (labor support) who provide pool rentals  Our practice has a Birth Pool in a Box tub at the hospital that you may borrow on a first-come-first-served basis. It is your responsibility to to set up, clean and break down the tub. We cannot guarantee the availability of this tub in advance. You are responsible for bringing all accessories listed above. If you do not have all necessary supplies you cannot have a waterbirth.    Things that would prevent you from having a waterbirth:  Premature, <37wks  Previous cesarean birth  Presence of thick meconium-stained fluid  Multiple gestation (Twins, triplets, etc.)  Uncontrolled diabetes or gestational diabetes requiring medication  Hypertension requiring medication or diagnosis of pre-eclampsia  Heavy  vaginal bleeding  Non-reassuring fetal heart rate  Active infection (MRSA, etc.). Group B Strep is NOT a contraindication for  waterbirth.  If your labor has to be induced and induction method requires continuous  monitoring of the baby's heart rate  Other risks/issues identified by your obstetrical provider  Please remember that birth is unpredictable. Under certain unforeseeable circumstances your provider may advise against giving birth in the tub. These decisions will be made on a case-by-case basis and with the safety of you and your baby as our highest priority.

## 2018-09-29 NOTE — Progress Notes (Signed)
   PRENATAL VISIT NOTE  Subjective:  Natalie Holmes is a 22 y.o. G1P0 at 8418w0d being seen today for ongoing prenatal care.  She is currently monitored for the following issues for this low-risk pregnancy and has Encounter for supervision of normal first pregnancy in third trimester on their problem list.  Patient reports no complaints.  Contractions: Not present. Vag. Bleeding: None.  Movement: Present. Denies leaking of fluid.   The following portions of the patient's history were reviewed and updated as appropriate: allergies, current medications, past family history, past medical history, past social history, past surgical history and problem list. Problem list updated.  Objective:   Vitals:   09/29/18 1024  BP: 123/74  Pulse: (!) 118  Weight: 142 lb (64.4 kg)    Fetal Status: Fetal Heart Rate (bpm): 156 Fundal Height: 30 cm Movement: Present     General:  Alert, oriented and cooperative. Patient is in no acute distress.  Skin: Skin is warm and dry. No rash noted.   Cardiovascular: Normal heart rate noted  Respiratory: Normal respiratory effort, no problems with respiration noted  Abdomen: Soft, gravid, appropriate for gestational age.  Pain/Pressure: Absent     Pelvic: Cervical exam deferred        Extremities: Normal range of motion.  Edema: None  Mental Status: Normal mood and affect. Normal behavior. Normal judgment and thought content.   Assessment and Plan:  Pregnancy: G1P0 at 1918w0d  1. Encounter for supervision of normal first pregnancy in third trimester - Routine care  Preterm labor symptoms and general obstetric precautions including but not limited to vaginal bleeding, contractions, leaking of fluid and fetal movement were reviewed in detail with the patient. Please refer to After Visit Summary for other counseling recommendations.  Return in about 2 weeks (around 10/13/2018).  Future Appointments  Date Time Provider Department Center  10/13/2018 10:55 AM Armando Reichert,   D, CNM [facility]  10/27/2018  8:55 AM Armando Reichert,  D, CNM [facility]    Thressa Sheller , CNM

## 2018-09-29 NOTE — MAU Note (Signed)
Vaginal bleeding post intercourse last night that hasn't let up since.  No LOF/CTX.  + FM.  Reports having some spotting earlier in the pregnancy but none since.

## 2018-09-29 NOTE — Discharge Instructions (Signed)
Pregnancy and Urinary Tract Infection °What is a urinary tract infection? °A urinary tract infection (UTI) is an infection of any part of the urinary tract. This includes the kidneys, the tubes that connect your kidneys to your bladder (ureters), the bladder, and the tube that carries urine out of your body (urethra). These organs make, store, and get rid of urine in the body. A UTI can be a bladder infection (cystitis) or a kidney infection (pyelonephritis). This infection may be caused by fungi, viruses, and bacteria. Bacteria are the most common cause of UTIs. °You are more likely to develop a UTI during pregnancy because: °· The physical and hormonal changes your body goes through can make it easier for bacteria to get into your urinary tract. °· Your growing baby puts pressure on your uterus and can affect urine flow. ° °Does a UTI place my baby at risk? °An untreated UTI during pregnancy could lead to a kidney infection, which can cause health problems that could affect your baby. Possible complications of an untreated UTI include: °· Having your baby before 37 weeks of pregnancy (premature). °· Having a baby with a low birth weight. °· Developing high blood pressure during pregnancy (preeclampsia). ° °What are the symptoms of a UTI? °Symptoms of a UTI include: °· Fever. °· Frequent urination or passing small amounts of urine frequently. °· Needing to urinate urgently. °· Pain or a burning sensation with urination. °· Urine that smells bad or unusual. °· Cloudy urine. °· Pain in the lower abdomen or back. °· Trouble urinating. °· Blood in the urine. °· Vomiting or being less hungry than normal. °· Diarrhea or abdominal pain. °· Vaginal discharge. ° °What are the treatment options for a UTI during pregnancy? °Treatment for this condition may include: °· Antibiotic medicines that are safe to take during pregnancy. °· Other medicines to treat less common causes of UTI. ° °How can I prevent a UTI? ° °To prevent a  UTI: °· Go to the bathroom as soon as you feel the need. °· Always wipe from front to back. °· Wash your genital area with soap and warm water daily. °· Empty your bladder before and after sex. °· Wear cotton underwear. °· Limit your intake of high sugar foods or drinks, such as regular soda, juice, and sweets. °· Drink 6-8 glasses of water daily. °· Do not wear tight-fitting pants. °· Do not douche or use deodorant sprays. °· Do not drink alcohol, caffeine, or carbonated drinks. These can irritate the bladder. ° °Contact a health care provider if: °· Your symptoms do not improve or get worse. °· You have a fever after two days of treatment. °· You have a rash. °· You have abnormal vaginal discharge. °· You have back or side pain. °· You have chills. °· You have nausea and vomiting. °Get help right away if: °Seek immediate medical care if you are pregnant and: °· You feel contractions in your uterus. °· You have lower belly pain. °· You have a gush of fluid from your vagina. °· You have blood in your urine. °· You are vomiting and cannot keep down any medicines or water. ° °This information is not intended to replace advice given to you by your health care provider. Make sure you discuss any questions you have with your health care provider. °Document Released: 02/16/2011 Document Revised: 10/05/2016 Document Reviewed: 09/12/2015 °Elsevier Interactive Patient Education © 2017 Elsevier Inc. ° °Safe Medications in Pregnancy  ° °Acne: °Benzoyl Peroxide °Salicylic Acid ° °  Backache/Headache: Tylenol: 2 regular strength every 4 hours OR              2 Extra strength every 6 hours  Colds/Coughs/Allergies: Benadryl (alcohol free) 25 mg every 6 hours as needed Breath right strips Claritin Cepacol throat lozenges Chloraseptic throat spray Cold-Eeze- up to three times per day Cough drops, alcohol free Flonase (by prescription only) Guaifenesin Mucinex Robitussin DM (plain only, alcohol free) Saline nasal  spray/drops Sudafed (pseudoephedrine) & Actifed ** use only after [redacted] weeks gestation and if you do not have high blood pressure Tylenol Vicks Vaporub Zinc lozenges Zyrtec   Constipation: Colace Ducolax suppositories Fleet enema Glycerin suppositories Metamucil Milk of magnesia Miralax Senokot Smooth move tea  Diarrhea: Kaopectate Imodium A-D  *NO pepto Bismol  Hemorrhoids: Anusol Anusol HC Preparation H Tucks  Indigestion: Tums Maalox Mylanta Zantac  Pepcid  Insomnia: Benadryl (alcohol free) 25mg  every 6 hours as needed Tylenol PM Unisom, no Gelcaps  Leg Cramps: Tums MagGel  Nausea/Vomiting:  Bonine Dramamine Emetrol Ginger extract Sea bands Meclizine  Nausea medication to take during pregnancy:  Unisom (doxylamine succinate 25 mg tablets) Take one tablet daily at bedtime. If symptoms are not adequately controlled, the dose can be increased to a maximum recommended dose of two tablets daily (1/2 tablet in the morning, 1/2 tablet mid-afternoon and one at bedtime). Vitamin B6 100mg  tablets. Take one tablet twice a day (up to 200 mg per day).  Skin Rashes: Aveeno products Benadryl cream or 25mg  every 6 hours as needed Calamine Lotion 1% cortisone cream  Yeast infection: Gyne-lotrimin 7 Monistat 7   **If taking multiple medications, please check labels to avoid duplicating the same active ingredients **take medication as directed on the label ** Do not exceed 4000 mg of tylenol in 24 hours **Do not take medications that contain aspirin or ibuprofen

## 2018-10-01 LAB — CULTURE, OB URINE: Culture: >=100000 — AB

## 2018-10-02 ENCOUNTER — Other Ambulatory Visit: Payer: Self-pay | Admitting: Obstetrics and Gynecology

## 2018-10-13 ENCOUNTER — Encounter: Payer: Self-pay | Admitting: Advanced Practice Midwife

## 2018-10-13 ENCOUNTER — Ambulatory Visit (INDEPENDENT_AMBULATORY_CARE_PROVIDER_SITE_OTHER): Payer: Medicaid Other | Admitting: Advanced Practice Midwife

## 2018-10-13 VITALS — BP 126/80 | HR 103 | Wt 145.9 lb

## 2018-10-13 DIAGNOSIS — Z3403 Encounter for supervision of normal first pregnancy, third trimester: Secondary | ICD-10-CM

## 2018-10-13 NOTE — Patient Instructions (Addendum)
AREA PEDIATRIC/FAMILY PRACTICE PHYSICIANS  [facility] [address], [address] [place], [place]  [address] Phone - [phone]   Fax - [phone]  [facility] [address] [place], [place] [address] Phone - [phone]   Fax - [phone]  [facility] [address] [place], [place]  [address] Phone - [phone]   Fax - [phone]  [facility] [address] [place], [place]  [address] Phone - [phone]   Fax - [phone]  [facility] [address] [place], [place]  [address] Phone - [phone]   Fax - [phone]  [facility] [address] [place], [place]  [address] Phone - [phone]   Fax - [phone]  [facility] [address] [place], [place]  [address] Phone - [phone]   Fax - [phone]  [facility] [address] [place], [place]  [address] Phone - [phone]   Fax - [phone]  [facility] [address] [place], [place]  [address] Phone - [phone]   Fax - [phone] [facility] [address] [place], [place]  [address] Phone - [phone]   Fax - [phone]  [facility] [address] [place], [place]  [address] Phone - [phone]   Fax - [phone]  [facility] [address] [place], [place]  [address] Phone - [phone]   Fax - [phone]  [facility] [address] [place], [place]  [address] Phone - [phone]   Fax - [phone]  SHILPA GOSRANI [address] [place], [place]  [address] Phone - [phone]  [facility] [address] [place], [place]  [address] Phone - [phone]   Fax - [phone]  [facility] [address] [place], [place]  [address] Phone - [phone]   Fax - [phone]  [facility] [address] [place], [place]  [address] Phone - [phone]   Fax - [phone]  [facility] [address] [place], [place]  [address] Phone - [phone]   Fax - [phone]   [facility] [address] [place], [place]  [address] Phone - [phone]   Fax - [phone]  [facility] [address] [place], [place]  [address] Phone - [phone]   Fax - [phone]  DAVID RUBIN [address] [place], [place]  [address] Phone - [phone]   Fax - [phone]  [facility] [address] , [place]  [address] Phone - [phone]   Fax - [phone]  [facility] [address] , [place]  [address] Phone - [phone]   Fax - [phone] [facility] [address] [place], [place]  [address] Phone - [phone]   Fax - [phone]  [facility] [address] [place], [place]  [address] Phone - [phone]   Fax - [phone]  [facility] [address] [place], [place]  [address] Phone - [phone]   Fax - [phone]  [facility] Charlene Flemming MD [address] [place] [address] Phone [phone]  Fax [phone]  Childbirth Education Options: [facility] Classes:  Childbirth education classes can help you   get ready for a positive parenting experience. You can also meet other expectant parents and get free stuff for your baby. Each class runs for five weeks on the same night and costs $45 for the mother-to-be and her support person. Medicaid covers the cost if you are eligible. Call [phone] to register. [facility] Childbirth Education:  [phone] or 5174845873 or  [email or web address]  Baby & Me Class: Discuss newborn & infant parenting and family adjustment issues with other new mothers in a relaxed environment. Each week brings a new speaker or baby-centered activity. We encourage new mothers to join [place] every Thursday at 11:00am. Babies birth until crawling. No registration or fee. Daddy [employer]: This course offers Dads-to-be the tools and knowledge needed to feel confident on their journey to becoming new fathers. Experienced dads, who have been trained as coaches, teach dads-to-be how to hold, comfort, diaper, swaddle and play with their infant while being able to support the new mom as well. A class for men taught by men. $25/dad Big Brother/Big Sister: Let your children share in the joy of a new brother or sister in this special class designed just for them. Class includes discussion about how families care for babies: swaddling, holding, diapering, safety as well as how they can be helpful in their new role. This class is designed for children ages 26 to 31, but any age is welcome. Please register each child individually. $5/child  Mom Talk: This mom-led group offers support and connection to mothers as they journey through the adjustments and struggles of that sometimes overwhelming first year after the birth of a child. Tuesdays at 10:00am and Thursdays at 6:00pm. Babies welcome. No registration or fee. Breastfeeding Support Group: This group is a mother-to-mother support circle where moms have the opportunity to share their breastfeeding experiences. A Lactation Consultant is present for questions and concerns. Meets each Tuesday at 11:00am. No fee or registration. Breastfeeding Your Baby: Learn what to expect in the first days of breastfeeding your newborn.  This class will help you feel more confident with the skills needed to begin your breastfeeding experience. Many new mothers are concerned about breastfeeding after leaving the hospital. This class  will also address the most common fears and challenges about breastfeeding during the first few weeks, months and beyond. (call for fee) Comfort Techniques and Tour: This 2 hour interactive class will provide you the opportunity to learn & practice hands-on techniques that can help relieve some of the discomfort of labor and encourage your baby to rotate toward the best position for birth. You and your partner will be able to try a variety of labor positions with birth balls and rebozos as well as practice breathing, relaxation, and visualization techniques. A tour of the [facility] is included with this class. $20 per registrant and support person Childbirth Class- Weekend Option: This class is a Weekend version of our Birth & Baby series. It is designed for parents who have a difficult time fitting several weeks of classes into their schedule. It covers the care of your newborn and the basics of labor and childbirth. It also includes a [place] [facility] and lunch. The class is held two consecutive days: beginning on Friday evening from 6:30 - 8:30 p.m. and the next day, Saturday from 9 a.m. - 4 p.m. (call for fee) Doren Custard Class: Interested in a waterbirth?  This informational class will help you discover whether waterbirth is the right fit for you.  Education about waterbirth itself, supplies you would need and how to assemble your support team is what you can expect from this class. Some obstetrical practices require this class in order to pursue a waterbirth. (Not all obstetrical practices offer waterbirth-check with your healthcare provider.) Register only the expectant mom, but you are encouraged to bring your partner to class! Required if planning waterbirth, no fee. Infant/Child CPR: Parents, grandparents, babysitters, and friends learn Cardio-Pulmonary Resuscitation skills for infants and children. You will also learn how to treat both conscious  and unconscious choking in infants and children. This Family & Friends program does not offer certification. Register each participant individually to ensure that enough mannequins are available. (Call for fee) Grandparent Love: Expecting a grandbaby? This class is for you! Learn about the latest infant care and safety recommendations and ways to support your own child as he or she transitions into the parenting role. Taught by Registered Nurses who are childbirth instructors, but most importantly...they are grandmothers too! $10/person. Childbirth Class- Natural Childbirth: This series of 5 weekly classes is for expectant parents who want to learn and practice natural methods of coping with the process of labor and childbirth. Relaxation, breathing, massage, visualization, role of the partner, and helpful positioning are highlighted. Participants learn how to be confident in their body's ability to give birth. This class will empower and help parents make informed decisions about their own care. Includes discussion that will help new parents transition into the immediate postpartum period. [facility] is included. We suggest taking this class between 25-32 weeks, but it's only a recommendation. $75 per registrant and one support person or $30 Medicaid. Childbirth Class- 3 week Series: This option of 3 weekly classes helps you and your labor partner prepare for childbirth. Newborn care, labor & birth, cesarean birth, pain management, and comfort techniques are discussed and a [place] [facility] is included. The class meets at the same time, on the same day of the week for 3 consecutive weeks beginning with the starting date you choose. $60 for registrant and one support person.  Marvelous Multiples: Expecting twins, triplets, or more? This class covers the differences in labor, birth, parenting, and breastfeeding issues that face multiples' parents.  NICU tour is included. Led by a Certified Childbirth Educator who is the mother of twins. No fee. Caring for Baby: This class is for expectant and adoptive parents who want to learn and practice the most up-to-date newborn care for their babies. Focus is on birth through the first six weeks of life. Topics include feeding, bathing, diapering, crying, umbilical cord care, circumcision care and safe sleep. Parents learn to recognize symptoms of illness and when to call the pediatrician. Register only the mom-to-be and your partner or support person can plan to come with you! $10 per registrant and support person Childbirth Class- online option: This online class offers you the freedom to complete a Birth and Baby series in the comfort of your own home. The flexibility of this option allows you to review sections at your own pace, at times convenient to you and your support people. It includes additional video information, animations, quizzes, and extended activities. Get organized with helpful eClass tools, checklists, and trackers. Once you register online for the class, you will receive an email within a few days to accept the invitation and begin the class when the time is right for you. The content will be available to you for 60 days. $  60 for 60 days of online access for you and your support people.  Local Doulas: Natural Baby Doulas [email or web address] Tel: [phone] [email or web address] [facility] [phone] [email or web address] The Labor Ladies  (also do waterbirth tub rental) [phone] [email or web address] [email or web address] [phone] [email or web address] [employer]hyth[employer]  [phone] [email or web address] [place] [facility] [employer] (PADA) [email or web address] http://[email or web address].org/index.htm [place] [facility]   [email or web address] Considering Waterbirth? Guide for patients at [facility]  Why consider waterbirth?  . Gentle birth for babies . Less pain medicine used in labor . May allow for passive descent/less pushing . May reduce perineal tears  . More mobility and instinctive maternal position changes . Increased maternal relaxation . Reduced blood pressure in labor  Is waterbirth safe? What are the risks of infection, drowning or other complications?  . Infection: o Very low risk (3.7 % for tub vs 4.8% for bed) o 7 in 8000 waterbirths with documented infection o Poorly cleaned equipment most common cause o Slightly lower group B strep transmission rate  . Drowning o Maternal:  - Very low risk   - Related to seizures or fainting o Newborn:  - Very low risk. No evidence of increased risk of respiratory problems in multiple large studies - Physiological protection from breathing under water - Avoid underwater birth if there are any fetal complications - Once baby's head is out of the water, keep it out.  . Birth complication o Some reports of cord trauma, but risk decreased by bringing baby to surface gradually o No evidence of increased risk of shoulder dystocia. Mothers can usually change positions faster in water than in a bed, possibly aiding the maneuvers to free the shoulder.   You must attend a Waterbirth class at [facility]  3rd Wednesday of every month from 7-9pm  Free  Register by calling [phone] or online at [email or web address]  Bring us the certificate from the class to your prenatal appointment  Meet with a midwife at 36 weeks to see if you can still plan a waterbirth and to sign the consent.   Purchase or rent the following supplies:   Water Birth Pool (Birth Pool in a Box or [place] for instance)  (Tubs start ~$125)  Single-use disposable tub liner designed for your brand of tub  New garden hose labeled  "lead-free", "suitable for drinking water",  Electric drain pump to remove water (We recommend 792 gallon per hour or greater pump.)   Separate garden hose to remove the dirty water  Fish net  Bathing suit top (optional)  Long-handled mirror (optional)  Places to purchase or rent supplies  [email or web address] for tub purchases and supplies  Waterbirthsolutions.com for tub purchases and supplies  The Labor Ladies ([email or web address]) $275 for tub rental/set-up & take down/kit   [place] [facility] [employer] (http://www.padanc.org/MeetUs.htm) Information regarding doulas (labor support) who provide pool rentals  Our practice has a Birth Pool in a Box tub at the hospital that you may borrow on a first-come-first-served basis. It is your responsibility to to set up, clean and break down the tub. We cannot guarantee the availability of this tub in advance. You are responsible for bringing all accessories listed above. If you do not have all necessary supplies you cannot have a waterbirth.    Things that would prevent you from having a waterbirth:  Premature, <37wks  Previous cesarean birth  Presence of thick meconium-stained fluid  Multiple gestation (Twins, triplets, etc.)  Uncontrolled diabetes or gestational diabetes requiring medication  Hypertension requiring medication   or diagnosis of pre-eclampsia  Heavy vaginal bleeding  Non-reassuring fetal heart rate  Active infection (MRSA, etc.). Group B Strep is NOT a contraindication for  waterbirth.  If your labor has to be induced and induction method requires continuous  monitoring of the baby's heart rate  Other risks/issues identified by your obstetrical provider  Please remember that birth is unpredictable. Under certain unforeseeable circumstances your provider may advise against giving birth in the tub. These decisions will be made on a case-by-case basis and with the safety of you and your baby as our highest  priority.

## 2018-10-13 NOTE — Progress Notes (Signed)
   PRENATAL VISIT NOTE  Subjective:  Natalie Holmes is a 22 y.o. G1P0 at 2129w0d being seen today for ongoing prenatal care.  She is currently monitored for the following issues for this low-risk pregnancy and has Encounter for supervision of normal first pregnancy in third trimester on their problem list.  Patient reports no complaints.  Contractions: Not present. Vag. Bleeding: None.  Movement: Present. Denies leaking of fluid.   The following portions of the patient's history were reviewed and updated as appropriate: allergies, current medications, past family history, past medical history, past social history, past surgical history and problem list. Problem list updated.  Objective:   Vitals:   10/13/18 1056  BP: 126/80  Pulse: (!) 103  Weight: 145 lb 14.4 oz (66.2 kg)    Fetal Status: Fetal Heart Rate (bpm): 151 Fundal Height: 32 cm Movement: Present     General:  Alert, oriented and cooperative. Patient is in no acute distress.  Skin: Skin is warm and dry. No rash noted.   Cardiovascular: Normal heart rate noted  Respiratory: Normal respiratory effort, no problems with respiration noted  Abdomen: Soft, gravid, appropriate for gestational age.  Pain/Pressure: Absent     Pelvic: Cervical exam deferred        Extremities: Normal range of motion.  Edema: None  Mental Status: Normal mood and affect. Normal behavior. Normal judgment and thought content.   Assessment and Plan:  Pregnancy: G1P0 at 429w0d  1. Encounter for supervision of normal first pregnancy in third trimester - Routine care - Patient's partner recently dx with primary HSV outbreak. Patient is questioning if she can have blood test done for HSV. Discussed limitations, but offered it today as an option. She is unsure at this time, and will consider it for next visit. Reviewed warning signs and to call for appt if she has an outbreak as a PCR culture is the best way to diagnose HSV.   Preterm labor symptoms and general  obstetric precautions including but not limited to vaginal bleeding, contractions, leaking of fluid and fetal movement were reviewed in detail with the patient. Please refer to After Visit Summary for other counseling recommendations.  Return in about 2 weeks (around 10/27/2018).  Future Appointments  Date Time Provider Department Center  10/27/2018  8:55 AM Armando Reichert,  D, CNM [facility]    Thressa Sheller , CNM

## 2018-10-27 ENCOUNTER — Encounter: Payer: Self-pay | Admitting: Advanced Practice Midwife

## 2018-10-27 ENCOUNTER — Ambulatory Visit (INDEPENDENT_AMBULATORY_CARE_PROVIDER_SITE_OTHER): Payer: Medicaid Other | Admitting: Advanced Practice Midwife

## 2018-10-27 VITALS — BP 111/72 | HR 86 | Wt 152.9 lb

## 2018-10-27 DIAGNOSIS — Z3403 Encounter for supervision of normal first pregnancy, third trimester: Secondary | ICD-10-CM

## 2018-10-27 NOTE — Progress Notes (Signed)
   PRENATAL VISIT NOTE  Subjective:  Natalie Holmes is a 22 y.o. G1P0 at 5133w0d being seen today for ongoing prenatal care.  She is currently monitored for the following issues for this low-risk pregnancy and has Encounter for supervision of normal first pregnancy in third trimester on their problem list.  Patient reports no complaints.  Contractions: Not present. Vag. Bleeding: None.  Movement: Present. Denies leaking of fluid.   The following portions of the patient's history were reviewed and updated as appropriate: allergies, current medications, past family history, past medical history, past social history, past surgical history and problem list. Problem list updated.  Objective:   Vitals:   10/27/18 0913  BP: 111/72  Pulse: 86  Weight: 152 lb 14.4 oz (69.4 kg)    Fetal Status: Fetal Heart Rate (bpm): 149 Fundal Height: 34 cm Movement: Present     General:  Alert, oriented and cooperative. Patient is in no acute distress.  Skin: Skin is warm and dry. No rash noted.   Cardiovascular: Normal heart rate noted  Respiratory: Normal respiratory effort, no problems with respiration noted  Abdomen: Soft, gravid, appropriate for gestational age.  Pain/Pressure: Absent     Pelvic: Cervical exam deferred        Extremities: Normal range of motion.  Edema: Trace  Mental Status: Normal mood and affect. Normal behavior. Normal judgment and thought content.   Assessment and Plan:  Pregnancy: G1P0 at 833w0d  1. Encounter for supervision of normal first pregnancy in third trimester - Routine care - GBS at next visit   Preterm labor symptoms and general obstetric precautions including but not limited to vaginal bleeding, contractions, leaking of fluid and fetal movement were reviewed in detail with the patient. Please refer to After Visit Summary for other counseling recommendations.  Return in about 2 weeks (around 11/10/2018).  No future appointments.  Thressa ShellerHeather [place], CNM

## 2018-10-27 NOTE — Patient Instructions (Signed)
AREA PEDIATRIC/FAMILY PRACTICE PHYSICIANS  [facility] [address], [address] [place], [place]  [address] Phone - [phone]   Fax - [phone]  [facility] [address] [place], [place] [address] Phone - [phone]   Fax - [phone]  [facility] [address] [place], [place]  [address] Phone - [phone]   Fax - [phone]  [facility] [address] [place], [place]  [address] Phone - [phone]   Fax - [phone]  [facility] [address] [place], [place]  [address] Phone - [phone]   Fax - [phone]  [facility] [address] [place], [place]  [address] Phone - [phone]   Fax - [phone]  [facility] [address] [place], [place]  [address] Phone - [phone]   Fax - [phone]  [facility] [address] [place], [place]  [address] Phone - [phone]   Fax - [phone]  [facility] [address] [place], [place]  [address] Phone - [phone]   Fax - [phone] [facility] [address] [place], [place]  [address] Phone - [phone]   Fax - [phone]  [facility] [address] [place], [place]  [address] Phone - [phone]   Fax - [phone]  [facility] [address] [place], [place]  [address] Phone - [phone]   Fax - [phone]  [facility] [address] [place], [place]  [address] Phone - [phone]   Fax - [phone]  [facility] GOSRANI [address] [place], [place]  [address] Phone - [phone]  [facility] [address] [place], [place]  [address] Phone - [phone]   Fax - [phone]  [facility] [address] [place], [place]  [address] Phone - [phone]   Fax - [phone]  [facility] [address] [place], [place]  [address] Phone - [phone]   Fax - [phone]  [facility] [address] [place], [place]  [address] Phone - [phone]   Fax - [phone]   [facility] [address] [place], [place]  [address] Phone - [phone]   Fax - [phone]  [facility] [address] [place], [place]  [address] Phone - [phone]   Fax - [phone]  DAVID RUBIN [address] [place], [place]  [address] Phone - [phone]   Fax - [phone]  [facility] [address] [place], [place]  [address] Phone - [phone]   Fax - [phone]  [facility] [address] , [place]  [address] Phone - [phone]   Fax - [phone] [facility] [address] [place], [place]  [address] Phone - [phone]   Fax - [phone]  [facility] [address] [place], [place]  [address] Phone - [phone]   Fax - [phone]  [facility] [address] [place], [place]  [address] Phone - [phone]   Fax - [phone]  [facility] Charlene Flemming MD [address] [place] [address] Phone [phone]  Fax [phone]  Childbirth Education Options: [facility] Classes:  Childbirth education classes can help you   get ready for a positive parenting experience. You can also meet other expectant parents and get free stuff for your baby. Each class runs for five weeks on the same night and costs $45 for the mother-to-be and her support person. Medicaid covers the cost if you are eligible. Call [phone] to register. [facility] Childbirth Education:  [phone] or [phone] or  [email or web address]  Baby & Me Class: Discuss newborn & infant parenting and family adjustment issues with other new mothers in a relaxed environment. Each week brings a new speaker or baby-centered activity. We encourage new mothers to join us every Thursday at 11:00am. Babies birth until crawling. No registration or fee. Daddy Boot Camp: This course offers Dads-to-be the tools and knowledge needed to feel confident on their journey to becoming new fathers. Experienced dads, who have been trained as coaches, teach dads-to-be how to hold, comfort, diaper, swaddle and play with their infant while being able to support the new mom as well. A class for men taught by men. $25/dad Big Brother/Big Sister: Let your children share in the joy of a new brother or sister in this special class designed just for them. Class includes discussion about how families care for babies: swaddling, holding, diapering, safety as well as how they can be helpful in their new role. This class is designed for children ages 2 to 6, but any age is welcome. Please register each child individually. $5/child  Mom Talk: This mom-led group offers support and connection to mothers as they journey through the adjustments and struggles of that sometimes overwhelming first year after the birth of a child. Tuesdays at 10:00am and Thursdays at 6:00pm. Babies welcome. No registration or fee. Breastfeeding Support Group: This group is a mother-to-mother support circle where moms have the opportunity to share their breastfeeding experiences. A Lactation Consultant is present for questions and concerns. Meets each Tuesday at 11:00am. No fee or registration. Breastfeeding Your Baby: Learn what to expect in the first days of breastfeeding your newborn.  This class will help you feel more confident with the skills needed to begin your breastfeeding experience. Many new mothers are concerned about breastfeeding after leaving the hospital. This class  will also address the most common fears and challenges about breastfeeding during the first few weeks, months and beyond. (call for fee) Comfort Techniques and Tour: This 2 hour interactive class will provide you the opportunity to learn & practice hands-on techniques that can help relieve some of the discomfort of labor and encourage your baby to rotate toward the best position for birth. You and your partner will be able to try a variety of labor positions with birth balls and rebozos as well as practice breathing, relaxation, and visualization techniques. A tour of the [facility] is included with this class. $20 per registrant and support person Childbirth Class- Weekend Option: This class is a Weekend version of our Birth & Baby series. It is designed for parents who have a difficult time fitting several weeks of classes into their schedule. It covers the care of your newborn and the basics of labor and childbirth. It also includes a Maternity Care Center Tour of [facility] and lunch. The class is held two consecutive days: beginning on Friday evening from 6:30 - 8:30 p.m. and the next day, Saturday from 9 a.m. - 4 p.m. (call for fee) Waterbirth Class: Interested in a waterbirth?  This informational class will help you discover whether waterbirth is the right fit for you.   Education about waterbirth itself, supplies you would need and how to assemble your support team is what you can expect from this class. Some obstetrical practices require this class in order to pursue a waterbirth. (Not all obstetrical practices offer waterbirth-check with your healthcare provider.) Register only the expectant mom, but you are encouraged to bring your partner to class! Required if planning waterbirth, no fee. Infant/Child CPR: Parents, grandparents, babysitters, and friends learn Cardio-Pulmonary Resuscitation skills for infants and children. You will also learn how to treat both conscious  and unconscious choking in infants and children. This Family & Friends program does not offer certification. Register each participant individually to ensure that enough mannequins are available. (Call for fee) Grandparent Love: Expecting a grandbaby? This class is for you! Learn about the latest infant care and safety recommendations and ways to support your own child as he or she transitions into the parenting role. Taught by Registered Nurses who are childbirth instructors, but most importantly...they are grandmothers too! $10/person. Childbirth Class- Natural Childbirth: This series of 5 weekly classes is for expectant parents who want to learn and practice natural methods of coping with the process of labor and childbirth. Relaxation, breathing, massage, visualization, role of the partner, and helpful positioning are highlighted. Participants learn how to be confident in their body's ability to give birth. This class will empower and help parents make informed decisions about their own care. Includes discussion that will help new parents transition into the immediate postpartum period. [facility] is included. We suggest taking this class between 25-32 weeks, but it's only a recommendation. $75 per registrant and one support person or $30 Medicaid. Childbirth Class- 3 week Series: This option of 3 weekly classes helps you and your labor partner prepare for childbirth. Newborn care, labor & birth, cesarean birth, pain management, and comfort techniques are discussed and a [place] [facility] is included. The class meets at the same time, on the same day of the week for 3 consecutive weeks beginning with the starting date you choose. $60 for registrant and one support person.  Marvelous Multiples: Expecting twins, triplets, or more? This class covers the differences in labor, birth, parenting, and breastfeeding issues that face multiples' parents.  NICU tour is included. Led by a Certified Childbirth Educator who is the mother of twins. No fee. Caring for Baby: This class is for expectant and adoptive parents who want to learn and practice the most up-to-date newborn care for their babies. Focus is on birth through the first six weeks of life. Topics include feeding, bathing, diapering, crying, umbilical cord care, circumcision care and safe sleep. Parents learn to recognize symptoms of illness and when to call the pediatrician. Register only the mom-to-be and your partner or support person can plan to come with you! $10 per registrant and support person Childbirth Class- online option: This online class offers you the freedom to complete a Birth and Baby series in the comfort of your own home. The flexibility of this option allows you to review sections at your own pace, at times convenient to you and your support people. It includes additional video information, animations, quizzes, and extended activities. Get organized with helpful eClass tools, checklists, and trackers. Once you register online for the class, you will receive an email within a few days to accept the invitation and begin the class when the time is right for you. The content will be available to you for 60 days. $  60 for 60 days of online access for you and your support people.  Local Doulas: Natural Baby Doulas [email or web address] Tel: [phone] [email or web address] [facility] [phone] [email or web address] The Labor Ladies  (also do waterbirth tub rental) [phone] [email or web address] [email or web address] [phone] [email or web address] Sacred [employer]hyth[employer]  [phone] [email or web address] [place] [facility] [employer] (PADA) [email or web address] http://www.padanc.org/index.htm [place] [facility]   [email or web address] Considering Waterbirth? Guide for patients at [facility]  Why consider waterbirth?  . Gentle birth for babies . Less pain medicine used in labor . May allow for passive descent/less pushing . May reduce perineal tears  . More mobility and instinctive maternal position changes . Increased maternal relaxation . Reduced blood pressure in labor  Is waterbirth safe? What are the risks of infection, drowning or other complications?  . Infection: o Very low risk (3.7 % for tub vs 4.8% for bed) o 7 in 8000 waterbirths with documented infection o Poorly cleaned equipment most common cause o Slightly lower group B strep transmission rate  . Drowning o Maternal:  - Very low risk   - Related to seizures or fainting o Newborn:  - Very low risk. No evidence of increased risk of respiratory problems in multiple large studies - Physiological protection from breathing under water - Avoid underwater birth if there are any fetal complications - Once baby's head is out of the water, keep it out.  . Birth complication o Some reports of cord trauma, but risk decreased by bringing baby to surface gradually o No evidence of increased risk of shoulder dystocia. Mothers can usually change positions faster in water than in a bed, possibly aiding the maneuvers to free the shoulder.   You must attend a Waterbirth class at [facility]  3rd Wednesday of every month from 7-9pm  Free  Register by calling [phone] or online at [email or web address]  Bring us the certificate from the class to your prenatal appointment  Meet with a midwife at 36 weeks to see if you can still plan a waterbirth and to sign the consent.   Purchase or rent the following supplies:   Water Birth Pool (Birth Pool in a Box or [place] for instance)  (Tubs start ~$125)  Single-use disposable tub liner designed for your brand of tub  New garden hose labeled  "lead-free", "suitable for drinking water",  Electric drain pump to remove water (We recommend 792 gallon per hour or greater pump.)   Separate garden hose to remove the dirty water  Fish net  Bathing suit top (optional)  Long-handled mirror (optional)  Places to purchase or rent supplies  Yourwaterbirth.com for tub purchases and supplies  Waterbirthsolutions.com for tub purchases and supplies  The Labor Ladies ([email or web address]) $275 for tub rental/set-up & take down/kit   [facility] [employer] (http://www.padanc.org/MeetUs.htm) Information regarding doulas (labor support) who provide pool rentals  Our practice has a Birth Pool in a Box tub at the hospital that you may borrow on a first-come-first-served basis. It is your responsibility to to set up, clean and break down the tub. We cannot guarantee the availability of this tub in advance. You are responsible for bringing all accessories listed above. If you do not have all necessary supplies you cannot have a waterbirth.    Things that would prevent you from having a waterbirth:  Premature, <37wks  Previous cesarean birth  Presence of thick meconium-stained fluid  Multiple gestation (Twins, triplets, etc.)  Uncontrolled diabetes or gestational diabetes requiring medication  Hypertension requiring medication   or diagnosis of pre-eclampsia  Heavy vaginal bleeding  Non-reassuring fetal heart rate  Active infection (MRSA, etc.). Group B Strep is NOT a contraindication for  waterbirth.  If your labor has to be induced and induction method requires continuous  monitoring of the baby's heart rate  Other risks/issues identified by your obstetrical provider  Please remember that birth is unpredictable. Under certain unforeseeable circumstances your provider may advise against giving birth in the tub. These decisions will be made on a case-by-case basis and with the safety of you and your baby as our highest  priority.      

## 2018-10-27 NOTE — Progress Notes (Signed)
Pt states is having a lot of swelling in hands & feet

## 2018-11-05 NOTE — L&D Delivery Note (Addendum)
Delivery Note At 9:38 AM a viable female was delivered via Vaginal, Spontaneous (Presentation: vertex; OA). APGAR: 8, 9; weight: 2.39 kg (5 lb 4.3 oz).   Placenta status: delivered spontaneously, intact.  Cord: 3-vessel with the following complications: none.  Cord pH: N/A  Anesthesia:  Epidural Episiotomy: None Lacerations:  2nd degree, perineal Suture Repair: 3.0 vicryl Est. Blood Loss (mL): 807  Mom to postpartum.  Baby to Couplet care / Skin to Skin.  Natalie Holmes 12/01/2018, 10:16 AM   The patient was noted to be complete and pushing. Patient noted to have epidural anesthesia.   The patient was asked to push and the head delivered spontaneously in the OA position, over an intact perineum. A body cord was identified draped over the neck and delivered through.   The anterior shoulder delivered easily and the posterior shoulder and body followed. The remainder of the infant was easily delivered and placed on mom's chest skin-to-skin where nursing staff. The infant was noted to have spontaneous cry and movement of all 4 extremities. The oropharynx and nasopharynx were bulb suctioned. After a 1-minute delay the cord was clamped x 2 and cut by father of the baby.   The placenta delivered intact spontaneously. Pitocin was started IV to firm the uterus.   Examination of the cervix and vaginal vault did not reveal any lacerations. Examination of the perineum showed 2nd degree laceration. The laceration was repaired with 3.0 vicryl. The patient tolerated the procedure well. The patient continued to have a trickle of blood from the vagina. The uterus was massaged and produced clots that were collected and weighed. Cytotec was given Buccally and TXa IV was initiated. The patient's bleeding slowed and then stopped. A vaginal pack was then placed.  All sponge and needle counts were correct. Dr. Chryl Heck was present for the entire procedure.    OB FELLOW DELIVERY ATTESTATION  I was  gloved and present for the delivery in its entirety, and I agree with the above resident's note.    Natalie Abbot, MD  OB Fellow  12/01/2018, 3:38 PM

## 2018-11-11 ENCOUNTER — Other Ambulatory Visit (HOSPITAL_COMMUNITY)
Admission: RE | Admit: 2018-11-11 | Discharge: 2018-11-11 | Disposition: A | Discharge status: Home / Self Care | Payer: Medicaid Other | Source: Ambulatory Visit | Attending: Student | Admitting: Student

## 2018-11-11 ENCOUNTER — Ambulatory Visit (INDEPENDENT_AMBULATORY_CARE_PROVIDER_SITE_OTHER): Payer: Medicaid Other | Admitting: Student

## 2018-11-11 ENCOUNTER — Encounter: Payer: Self-pay | Admitting: Student

## 2018-11-11 VITALS — BP 133/81 | HR 116 | Wt 156.3 lb

## 2018-11-11 DIAGNOSIS — Z3403 Encounter for supervision of normal first pregnancy, third trimester: Secondary | ICD-10-CM | POA: Insufficient documentation

## 2018-11-11 DIAGNOSIS — Z880 Allergy status to penicillin: Secondary | ICD-10-CM

## 2018-11-11 LAB — OB RESULTS CONSOLE GBS: GBS: NEGATIVE

## 2018-11-11 NOTE — Patient Instructions (Addendum)
Contraception Choices Contraception, also called birth control, refers to methods or devices that prevent pregnancy. Hormonal methods Contraceptive implant  A contraceptive implant is a thin, plastic tube that contains a hormone. It is inserted into the upper part of the arm. It can remain in place for up to 3 years. Progestin-only injections Progestin-only injections are injections of progestin, a synthetic form of the hormone progesterone. They are given every 3 months by a health care provider. Birth control pills  Birth control pills are pills that contain hormones that prevent pregnancy. They must be taken once a day, preferably at the same time each day. Birth control patch  The birth control patch contains hormones that prevent pregnancy. It is placed on the skin and must be changed once a week for three weeks and removed on the fourth week. A prescription is needed to use this method of contraception. Vaginal ring  A vaginal ring contains hormones that prevent pregnancy. It is placed in the vagina for three weeks and removed on the fourth week. After that, the process is repeated with a new ring. A prescription is needed to use this method of contraception. Emergency contraceptive Emergency contraceptives prevent pregnancy after unprotected sex. They come in pill form and can be taken up to 5 days after sex. They work best the sooner they are taken after having sex. Most emergency contraceptives are available without a prescription. This method should not be used as your only form of birth control. Barrier methods Female condom  A female condom is a thin sheath that is worn over the penis during sex. Condoms keep sperm from going inside a woman's body. They can be used with a spermicide to increase their effectiveness. They should be disposed after a single use. Female condom  A female condom is a soft, loose-fitting sheath that is put into the vagina before sex. The condom keeps sperm  from going inside a woman's body. They should be disposed after a single use. Diaphragm  A diaphragm is a soft, dome-shaped barrier. It is inserted into the vagina before sex, along with a spermicide. The diaphragm blocks sperm from entering the uterus, and the spermicide kills sperm. A diaphragm should be left in the vagina for 6-8 hours after sex and removed within 24 hours. A diaphragm is prescribed and fitted by a health care provider. A diaphragm should be replaced every 1-2 years, after giving birth, after gaining more than 15 lb (6.8 kg), and after pelvic surgery. Cervical cap  A cervical cap is a round, soft latex or plastic cup that fits over the cervix. It is inserted into the vagina before sex, along with spermicide. It blocks sperm from entering the uterus. The cap should be left in place for 6-8 hours after sex and removed within 48 hours. A cervical cap must be prescribed and fitted by a health care provider. It should be replaced every 2 years. Sponge  A sponge is a soft, circular piece of polyurethane foam with spermicide on it. The sponge helps block sperm from entering the uterus, and the spermicide kills sperm. To use it, you make it wet and then insert it into the vagina. It should be inserted before sex, left in for at least 6 hours after sex, and removed and thrown away within 30 hours. Spermicides Spermicides are chemicals that kill or block sperm from entering the cervix and uterus. They can come as a cream, jelly, suppository, foam, or tablet. A spermicide should be inserted into the  vagina with an applicator at least 16-10 minutes before sex to allow time for it to work. The process must be repeated every time you have sex. Spermicides do not require a prescription. Intrauterine contraception Intrauterine device (IUD) An IUD is a T-shaped device that is put in a woman's uterus. There are two types:  Hormone IUD.This type contains progestin, a synthetic form of the hormone  progesterone. This type can stay in place for 3-5 years.  Copper IUD.This type is wrapped in copper wire. It can stay in place for 10 years.  Permanent methods of contraception Female tubal ligation In this method, a woman's fallopian tubes are sealed, tied, or blocked during surgery to prevent eggs from traveling to the uterus. Hysteroscopic sterilization In this method, a small, flexible insert is placed into each fallopian tube. The inserts cause scar tissue to form in the fallopian tubes and block them, so sperm cannot reach an egg. The procedure takes about 3 months to be effective. Another form of birth control must be used during those 3 months. Female sterilization This is a procedure to tie off the tubes that carry sperm (vasectomy). After the procedure, the man can still ejaculate fluid (semen). Natural planning methods Natural family planning In this method, a couple does not have sex on days when the woman could become pregnant. Calendar method This means keeping track of the length of each menstrual cycle, identifying the days when pregnancy can happen, and not having sex on those days. Ovulation method In this method, a couple avoids sex during ovulation. Symptothermal method This method involves not having sex during ovulation. The woman typically checks for ovulation by watching changes in her temperature and in the consistency of cervical mucus. Post-ovulation method In this method, a couple waits to have sex until after ovulation. Summary  Contraception, also called birth control, means methods or devices that prevent pregnancy.  Hormonal methods of contraception include implants, injections, pills, patches, vaginal rings, and emergency contraceptives.  Barrier methods of contraception can include female condoms, female condoms, diaphragms, cervical caps, sponges, and spermicides.  There are two types of IUDs (intrauterine devices). An IUD can be put in a woman's uterus to  prevent pregnancy for 3-5 years.  Permanent sterilization can be done through a procedure for males, females, or both.  Natural family planning methods involve not having sex on days when the woman could become pregnant. This information is not intended to replace advice given to you by your health care provider. Make sure you discuss any questions you have with your health care provider. Document Released: 10/22/2005 Document Revised: 10/24/2017 Document Reviewed: 11/24/2016 Elsevier Interactive Patient Education  2019 [place] Education Options: [facility] Department Classes:  Childbirth education classes can help you get ready for a positive parenting experience. You can also meet other expectant parents and get free stuff for your baby. Each class runs for five weeks on the same night and costs $45 for the mother-to-be and her support person. Medicaid covers the cost if you are eligible. Call [phone] to register. [facility] Childbirth Education:  [phone] or [phone] or [email or web address]  Baby & Me Class: Discuss newborn & infant parenting and family adjustment issues with other new mothers in a relaxed environment. Each week brings a new speaker or baby-centered activity. We encourage new mothers to join [place] every Thursday at 11:00am. Babies birth until crawling. No registration or fee. Daddy [employer]: This course offers Dads-to-be the tools  and knowledge needed to feel confident on their journey to becoming new fathers. Experienced dads, who have been trained as coaches, teach dads-to-be how to hold, comfort, diaper, swaddle and play with their infant while being able to support the new mom as well. A class for men taught by men. $25/dad Big Brother/Big Sister: Let your children share in the joy of a new brother or sister in this special class designed just for them. Class includes discussion about how families care for babies:  swaddling, holding, diapering, safety as well as how they can be helpful in their new role. This class is designed for children ages 17 to 82, but any age is welcome. Please register each child individually. $5/child  Mom Talk: This mom-led group offers support and connection to mothers as they journey through the adjustments and struggles of that sometimes overwhelming first year after the birth of a child. Tuesdays at 10:00am and Thursdays at 6:00pm. Babies welcome. No registration or fee. Breastfeeding Support Group: This group is a mother-to-mother support circle where moms have the opportunity to share their breastfeeding experiences. A Lactation Consultant is present for questions and concerns. Meets each Tuesday at 11:00am. No fee or registration. Breastfeeding Your Baby: Learn what to expect in the first days of breastfeeding your newborn.  This class will help you feel more confident with the skills needed to begin your breastfeeding experience. Many new mothers are concerned about breastfeeding after leaving the hospital. This class will also address the most common fears and challenges about breastfeeding during the first few weeks, months and beyond. (call for fee) Comfort Techniques and Tour: This 2 hour interactive class will provide you the opportunity to learn & practice hands-on techniques that can help relieve some of the discomfort of labor and encourage your baby to rotate toward the best position for birth. You and your partner will be able to try a variety of labor positions with birth balls and rebozos as well as practice breathing, relaxation, and visualization techniques. A tour of the [facility] is included with this class. $20 per registrant and support person Childbirth Class- Weekend Option: This class is a Weekend version of our Birth & Baby series. It is designed for parents who have a difficult time fitting several weeks of classes into their schedule.  It covers the care of your newborn and the basics of labor and childbirth. It also includes a [place] [facility] and lunch. The class is held two consecutive days: beginning on Friday evening from 6:30 - 8:30 p.m. and the next day, Saturday from 9 a.m. - 4 p.m. (call for fee) Doren Custard Class: Interested in a waterbirth?  This informational class will help you discover whether waterbirth is the right fit for you. Education about waterbirth itself, supplies you would need and how to assemble your support team is what you can expect from this class. Some obstetrical practices require this class in order to pursue a waterbirth. (Not all obstetrical practices offer waterbirth-check with your healthcare provider.) Register only the expectant mom, but you are encouraged to bring your partner to class! Required if planning waterbirth, no fee. Infant/Child CPR: Parents, grandparents, babysitters, and friends learn Cardio-Pulmonary Resuscitation skills for infants and children. You will also learn how to treat both conscious and unconscious choking in infants and children. This Family & Friends program does not offer certification. Register each participant individually to ensure that enough mannequins are available. (Call for fee) Grandparent Love:  Expecting a grandbaby? This class is for you! Learn about the latest infant care and safety recommendations and ways to support your own child as he or she transitions into the parenting role. Taught by Registered Nurses who are childbirth instructors, but most importantly...they are grandmothers too! $10/person. Childbirth Class- Natural Childbirth: This series of 5 weekly classes is for expectant parents who want to learn and practice natural methods of coping with the process of labor and childbirth. Relaxation, breathing, massage, visualization, role of the partner, and helpful positioning are highlighted. Participants learn how to be confident  in their body's ability to give birth. This class will empower and help parents make informed decisions about their own care. Includes discussion that will help new parents transition into the immediate postpartum period. [facility] is included. We suggest taking this class between 25-32 weeks, but it's only a recommendation. $75 per registrant and one support person or $30 Medicaid. Childbirth Class- 3 week Series: This option of 3 weekly classes helps you and your labor partner prepare for childbirth. Newborn care, labor & birth, cesarean birth, pain management, and comfort techniques are discussed and a [place] [facility] is included. The class meets at the same time, on the same day of the week for 3 consecutive weeks beginning with the starting date you choose. $60 for registrant and one support person.  Marvelous Multiples: Expecting twins, triplets, or more? This class covers the differences in labor, birth, parenting, and breastfeeding issues that face multiples' parents. NICU tour is included. Led by a Certified Childbirth Educator who is the mother of twins. No fee. Caring for Baby: This class is for expectant and adoptive parents who want to learn and practice the most up-to-date newborn care for their babies. Focus is on birth through the first six weeks of life. Topics include feeding, bathing, diapering, crying, umbilical cord care, circumcision care and safe sleep. Parents learn to recognize symptoms of illness and when to call the pediatrician. Register only the mom-to-be and your partner or support person can plan to come with you! $10 per registrant and support person Childbirth Class- online option: This online class offers you the freedom to complete a Birth and Baby series in the comfort of your own home. The flexibility of this option allows you to review sections at your own pace, at times convenient to you and your support  people. It includes additional video information, animations, quizzes, and extended activities. Get organized with helpful eClass tools, checklists, and trackers. Once you register online for the class, you will receive an email within a few days to accept the invitation and begin the class when the time is right for you. The content will be available to you for 60 days. $60 for 60 days of online access for you and your support people.  Local Doulas: Natural Baby Doulas [email or web address] Tel: [phone] [email or web address] [employer] [phone] [email or web address] The Labor Hassell Halim  (also do waterbirth tub rental) [phone] [email or web address] [facility] Birth Doula [phone] [email or web address] NotebookDistributors.fi Sacred Rhythms  [phone] https://[email or web address]-[email or web address]/ [employer] (PADA) [email or web address] [place] Birth and Baby  [email or web address] Considering Waterbirth? Guide for patients at [facility] [employer]  Why consider waterbirth?  . Gentle birth for babies . Less pain medicine used in labor . May allow for passive descent/less pushing . May reduce perineal tears  . More mobility and instinctive maternal position changes . Increased maternal relaxation . Reduced blood pressure in  labor  Is waterbirth safe? What are the risks of infection, drowning or other complications?  . Infection: o Very low risk (3.7 % for tub vs 4.8% for bed) o 7 in 8000 waterbirths with documented infection o Poorly cleaned equipment most common cause o Slightly lower group B strep transmission rate  . Drowning o Maternal:  - Very low risk   - Related to seizures or fainting o Newborn:  - Very low risk. No evidence of increased risk of respiratory problems in multiple large  studies - Physiological protection from breathing under water - Avoid underwater birth if there are any fetal complications - Once baby's head is out of the water, keep it out.  . Birth complication o Some reports of cord trauma, but risk decreased by bringing baby to surface gradually o No evidence of increased risk of shoulder dystocia. Mothers can usually change positions faster in water than in a bed, possibly aiding the maneuvers to free the shoulder.   You must attend a Doren Custard class at [facility]  3rd Wednesday of every month from 7-9pm  [employer] by calling [phone] or online at [email or web address]  Bring [place] the certificate from the class to your prenatal appointment  Meet with a midwife at 36 weeks to see if you can still plan a waterbirth and to sign the consent.   Purchase or rent the following supplies:   Water Birth Pool (Birth Pool in a Box or [place] for instance)  (Tubs start ~$125)  Single-use disposable tub liner designed for your brand of tub  New garden hose labeled "lead-free", "suitable for drinking water",  Electric drain pump to remove water (We recommend 792 gallon per hour or greater pump.)   Separate garden hose to remove the dirty water  Fish net  Bathing suit top (optional)  Long-handled mirror (optional)  Places to purchase or rent supplies  GotWebTools.is for tub purchases and supplies  Waterbirthsolutions.com for tub purchases and supplies  The Labor Ladies (www.[email or web address]) $275 for tub rental/set-up & take down/kit   [employer] (http://www.fleming.com/.htm) Information regarding doulas (labor support) who provide pool rentals  Our practice has a Birth Pool in a Box tub at the hospital that you may borrow on a first-come-first-served basis. It is your responsibility to to set up, clean and break down the tub. We cannot guarantee the availability of this tub in advance. You  are responsible for bringing all accessories listed above. If you do not have all necessary supplies you cannot have a waterbirth.    Things that would prevent you from having a waterbirth:  Premature, <37wks  Previous cesarean birth  Presence of thick meconium-stained fluid  Multiple gestation (Twins, triplets, etc.)  Uncontrolled diabetes or gestational diabetes requiring medication  Hypertension requiring medication or diagnosis of pre-eclampsia  Heavy vaginal bleeding  Non-reassuring fetal heart rate  Active infection (MRSA, etc.). Group B Strep is NOT a contraindication for  waterbirth.  If your labor has to be induced and induction method requires continuous  monitoring of the baby's heart rate  Other risks/issues identified by your obstetrical provider  Please remember that birth is unpredictable. Under certain unforeseeable circumstances your provider may advise against giving birth in the tub. These decisions will be made on a case-by-case basis and with the safety of you and your baby as our highest priority.             Marland Kitchen

## 2018-11-11 NOTE — Progress Notes (Signed)
   PRENATAL VISIT NOTE  Subjective:  Natalie Holmes is a 23 y.o. G1P0 at [redacted]w[redacted]d being seen today for ongoing prenatal care.  She is currently monitored for the following issues for this low-risk pregnancy and has Encounter for supervision of normal first pregnancy in third trimester and Penicillin allergy on their problem list.  Patient reports no complaints.  Contractions: Irritability. Vag. Bleeding: Bloody Show.  Movement: Present. Denies leaking of fluid.   The following portions of the patient's history were reviewed and updated as appropriate: allergies, current medications, past family history, past medical history, past social history, past surgical history and problem list. Problem list updated.  Objective:   Vitals:   11/11/18 1005  BP: 133/81  Pulse: (!) 116  Weight: 156 lb 4.8 oz (70.9 kg)    Fetal Status: Fetal Heart Rate (bpm): 160 Fundal Height: 35 cm Movement: Present  Presentation: Vertex  General:  Alert, oriented and cooperative. Patient is in no acute distress.  Skin: Skin is warm and dry. No rash noted.   Cardiovascular: Normal heart rate noted  Respiratory: Normal respiratory effort, no problems with respiration noted  Abdomen: Soft, gravid, appropriate for gestational age.  Pain/Pressure: Present     Pelvic: Cervical exam performed Dilation: 1 Effacement (%): Thick Station: -3  Extremities: Normal range of motion.  Edema: Trace  Mental Status: Normal mood and affect. Normal behavior. Normal judgment and thought content.   Assessment and Plan:  Pregnancy: G1P0 at [redacted]w[redacted]d  1. Encounter for supervision of normal first pregnancy in third trimester  - GC/Chlamydia probe amp ([place])not at [facility] - Strep Gp B Culture+Rflx  2. Penicillin allergy -GBS w/reflex collected  Term labor symptoms and general obstetric precautions including but not limited to vaginal bleeding, contractions, leaking of fluid and fetal movement were reviewed in detail with the  patient. Please refer to After Visit Summary for other counseling recommendations.  Return in about 1 week (around 11/18/2018) for Routine OB.  Future Appointments  Date Time Provider Department Center  11/17/2018  9:15 AM Armando Reichert, CNM [facility]  11/24/2018  9:15 AM Judeth Horn, NP [facility]  12/01/2018  9:15 AM Armando Reichert, CNM [facility]    Judeth Horn, NP

## 2018-11-12 LAB — GC/CHLAMYDIA PROBE AMP (~~LOC~~) NOT AT ARMC
Chlamydia: NEGATIVE
Neisseria Gonorrhea: NEGATIVE

## 2018-11-14 LAB — STREP GP B CULTURE+RFLX: Strep Gp B Culture+Rflx: NEGATIVE

## 2018-11-17 ENCOUNTER — Ambulatory Visit (INDEPENDENT_AMBULATORY_CARE_PROVIDER_SITE_OTHER): Payer: Medicaid Other | Admitting: Advanced Practice Midwife

## 2018-11-17 ENCOUNTER — Encounter: Payer: Self-pay | Admitting: Advanced Practice Midwife

## 2018-11-17 VITALS — BP 128/79 | HR 79 | Wt 159.2 lb

## 2018-11-17 DIAGNOSIS — Z3A37 37 weeks gestation of pregnancy: Secondary | ICD-10-CM

## 2018-11-17 DIAGNOSIS — Z3403 Encounter for supervision of normal first pregnancy, third trimester: Secondary | ICD-10-CM

## 2018-11-17 NOTE — Progress Notes (Signed)
   PRENATAL VISIT NOTE  Subjective:  Natalie Holmes is a 23 y.o. G1P0 at [redacted]w[redacted]d being seen today for ongoing prenatal care.  She is currently monitored for the following issues for this low-risk pregnancy and has Encounter for supervision of normal first pregnancy in third trimester and Penicillin allergy on their problem list.  Patient reports no complaints.  Contractions: Irregular. Vag. Bleeding: None.  Movement: Present. Denies leaking of fluid.   The following portions of the patient's history were reviewed and updated as appropriate: allergies, current medications, past family history, past medical history, past social history, past surgical history and problem list. Problem list updated.  Objective:   Vitals:   11/17/18 0919  BP: 128/79  Pulse: 79  Weight: 159 lb 3.2 oz (72.2 kg)    Fetal Status: Fetal Heart Rate (bpm): 135 Fundal Height: 37 cm Movement: Present     General:  Alert, oriented and cooperative. Patient is in no acute distress.  Skin: Skin is warm and dry. No rash noted.   Cardiovascular: Normal heart rate noted  Respiratory: Normal respiratory effort, no problems with respiration noted  Abdomen: Soft, gravid, appropriate for gestational age.  Pain/Pressure: Absent     Pelvic: Cervical exam deferred        Extremities: Normal range of motion.  Edema: Trace  Mental Status: Normal mood and affect. Normal behavior. Normal judgment and thought content.   Assessment and Plan:  Pregnancy: G1P0 at [redacted]w[redacted]d  1. Encounter for supervision of normal first pregnancy in third trimester - Routine care - GBS negative   Term labor symptoms and general obstetric precautions including but not limited to vaginal bleeding, contractions, leaking of fluid and fetal movement were reviewed in detail with the patient. Please refer to After Visit Summary for other counseling recommendations.  Return in about 1 week (around 11/24/2018).  Future Appointments  Date Time Provider Department  Center  11/24/2018  9:15 AM Reva Bores, MD [facility]  12/01/2018  9:15 AM Armando Reichert, CNM [facility]    Thressa Sheller DNP, CNM  11/17/18  9:35 AM

## 2018-11-17 NOTE — Patient Instructions (Signed)
Vaginal delivery means that you give birth by pushing your baby out of your birth canal (vagina). A team of health care providers will help you before, during, and after vaginal delivery. Birth experiences are unique for every woman and every pregnancy, and birth experiences vary depending on where you choose to give birth. What happens when I arrive at the birth center or hospital? Once you are in labor and have been admitted into the hospital or birth center, your health care provider may:  Review your pregnancy history and any concerns that you have.  Insert an IV into one of your veins. This may be used to give you fluids and medicines.  Check your blood pressure, pulse, temperature, and heart rate (vital signs).  Check whether your bag of water (amniotic sac) has broken (ruptured).  Talk with you about your birth plan and discuss pain control options. Monitoring Your health care provider may monitor your contractions (uterine monitoring) and your baby's heart rate (fetal monitoring). You may need to be monitored:  Often, but not continuously (intermittently).  All the time or for long periods at a time (continuously). Continuous monitoring may be needed if: ? You are taking certain medicines, such as medicine to relieve pain or make your contractions stronger. ? You have pregnancy or labor complications. Monitoring may be done by:  Placing a special stethoscope or a handheld monitoring device on your abdomen to check your baby's heartbeat and to check for contractions.  Placing monitors on your abdomen (external monitors) to record your baby's heartbeat and the frequency and length of contractions.  Placing monitors inside your uterus through your vagina (internal monitors) to record your baby's heartbeat and the frequency, length, and strength of your contractions. Depending on the type of monitor, it may remain in your uterus or on your baby's head until birth.  Telemetry. This is  a type of continuous monitoring that can be done with external or internal monitors. Instead of having to stay in bed, you are able to move around during telemetry. Physical exam Your health care provider may perform frequent physical exams. This may include:  Checking how and where your baby is positioned in your uterus.  Checking your cervix to determine: ? Whether it is thinning out (effacing). ? Whether it is opening up (dilating). What happens during labor and delivery?  Normal labor and delivery is divided into the following three stages: Stage 1  This is the longest stage of labor.  This stage can last for hours or days.  Throughout this stage, you will feel contractions. Contractions generally feel mild, infrequent, and irregular at first. They get stronger, more frequent (about every 2-3 minutes), and more regular as you move through this stage.  This stage ends when your cervix is completely dilated to 4 inches (10 cm) and completely effaced. Stage 2  This stage starts once your cervix is completely effaced and dilated and lasts until the delivery of your baby.  This stage may last from 20 minutes to 2 hours.  This is the stage where you will feel an urge to push your baby out of your vagina.  You may feel stretching and burning pain, especially when the widest part of your baby's head passes through the vaginal opening (crowning).  Once your baby is delivered, the umbilical cord will be clamped and cut. This usually occurs after waiting a period of 1-2 minutes after delivery.  Your baby will be placed on your bare chest (skin-to-skin contact) in   an upright position and covered with a warm blanket. Watch your baby for feeding cues, like rooting or sucking, and help the baby to your breast for his or her first feeding. Stage 3  This stage starts immediately after the birth of your baby and ends after you deliver the placenta.  This stage may take anywhere from 5 to 30  minutes.  After your baby has been delivered, you will feel contractions as your body expels the placenta and your uterus contracts to control bleeding. What can I expect after labor and delivery?  After labor is over, you and your baby will be monitored closely until you are ready to go home to ensure that you are both healthy. Your health care team will teach you how to care for yourself and your baby.  You and your baby will stay in the same room (rooming in) during your hospital stay. This will encourage early bonding and successful breastfeeding.  You may continue to receive fluids and medicines through an IV.  Your uterus will be checked and massaged regularly (fundal massage).  You will have some soreness and pain in your abdomen, vagina, and the area of skin between your vaginal opening and your anus (perineum).  If an incision was made near your vagina (episiotomy) or if you had some vaginal tearing during delivery, cold compresses may be placed on your episiotomy or your tear. This helps to reduce pain and swelling.  You may be given a squirt bottle to use instead of wiping when you go to the bathroom. To use the squirt bottle, follow these steps: ? Before you urinate, fill the squirt bottle with warm water. Do not use hot water. ? After you urinate, while you are sitting on the toilet, use the squirt bottle to rinse the area around your urethra and vaginal opening. This rinses away any urine and blood. ? Fill the squirt bottle with clean water every time you use the bathroom.  It is normal to have vaginal bleeding after delivery. Wear a sanitary pad for vaginal bleeding and discharge. Summary  Vaginal delivery means that you will give birth by pushing your baby out of your birth canal (vagina).  Your health care provider may monitor your contractions (uterine monitoring) and your baby's heart rate (fetal monitoring).  Your health care provider may perform a physical  exam.  Normal labor and delivery is divided into three stages.  After labor is over, you and your baby will be monitored closely until you are ready to go home. This information is not intended to replace advice given to you by your health care provider. Make sure you discuss any questions you have with your health care provider. Document Released: 07/31/2008 Document Revised: 11/26/2017 Document Reviewed: 11/26/2017 Elsevier Interactive Patient Education  2019 Elsevier Inc[employer]  

## 2018-11-24 ENCOUNTER — Ambulatory Visit (INDEPENDENT_AMBULATORY_CARE_PROVIDER_SITE_OTHER): Payer: Medicaid Other | Admitting: Family Medicine

## 2018-11-24 ENCOUNTER — Encounter: Payer: Self-pay | Admitting: Family Medicine

## 2018-11-24 ENCOUNTER — Encounter: Payer: Self-pay | Admitting: Student

## 2018-11-24 VITALS — BP 127/84 | HR 96 | Wt 163.2 lb

## 2018-11-24 DIAGNOSIS — Z3403 Encounter for supervision of normal first pregnancy, third trimester: Secondary | ICD-10-CM

## 2018-11-24 LAB — POCT URINALYSIS DIP (DEVICE)
Bilirubin Urine: NEGATIVE
Glucose, UA: NEGATIVE mg/dL
HGB URINE DIPSTICK: NEGATIVE
Ketones, ur: NEGATIVE mg/dL
Nitrite: NEGATIVE
PH: 7 (ref 5.0–8.0)
Protein, ur: NEGATIVE mg/dL
Specific Gravity, Urine: 1.02 (ref 1.005–1.030)
Urobilinogen, UA: 0.2 mg/dL (ref 0.0–1.0)

## 2018-11-24 NOTE — Patient Instructions (Signed)

## 2018-11-24 NOTE — Progress Notes (Signed)
   PRENATAL VISIT NOTE  Subjective:  Natalie Holmes is a 23 y.o. G1P0 at [redacted]w[redacted]d being seen today for ongoing prenatal care.  She is currently monitored for the following issues for this low-risk pregnancy and has Encounter for supervision of normal first pregnancy in third trimester and Penicillin allergy on their problem list.  Patient reports no complaints.  Contractions: Irregular. Vag. Bleeding: None.  Movement: Present. Denies leaking of fluid.   The following portions of the patient's history were reviewed and updated as appropriate: allergies, current medications, past family history, past medical history, past social history, past surgical history and problem list. Problem list updated.  Objective:   Vitals:   11/24/18 1001 11/24/18 1004  BP: (!) 128/92 127/84  Pulse: 79 96  Weight: 163 lb 3.2 oz (74 kg)     Fetal Status: Fetal Heart Rate (bpm): 158 Fundal Height: 35 cm Movement: Present  Presentation: Vertex  General:  Alert, oriented and cooperative. Patient is in no acute distress.  Skin: Skin is warm and dry. No rash noted.   Cardiovascular: Normal heart rate noted  Respiratory: Normal respiratory effort, no problems with respiration noted  Abdomen: Soft, gravid, appropriate for gestational age.  Pain/Pressure: Present     Pelvic: Cervical exam deferred        Extremities: Normal range of motion.  Edema: Trace  Mental Status: Normal mood and affect. Normal behavior. Normal judgment and thought content.   Assessment and Plan:  Pregnancy: G1P0 at [redacted]w[redacted]d  1. Encounter for supervision of normal first pregnancy in third trimester BP is mildly elevated, some intermittent h/a's. Urine protein is negative today  Term labor symptoms and general obstetric precautions including but not limited to vaginal bleeding, contractions, leaking of fluid and fetal movement were reviewed in detail with the patient. Please refer to After Visit Summary for other counseling recommendations.    Return in 1 week (on 12/01/2018).  Future Appointments  Date Time Provider Department Center  12/01/2018  9:15 AM Armando Reichert, CNM [facility]    Reva Bores, MD

## 2018-11-29 ENCOUNTER — Encounter (HOSPITAL_COMMUNITY): Payer: Self-pay

## 2018-11-29 ENCOUNTER — Other Ambulatory Visit: Payer: Self-pay

## 2018-11-29 ENCOUNTER — Inpatient Hospital Stay (HOSPITAL_COMMUNITY)
Admission: AD | Admit: 2018-11-29 | Discharge: 2018-12-03 | DRG: 807 | Disposition: A | Discharge status: Home / Self Care | Payer: Medicaid Other | Attending: Obstetrics & Gynecology | Admitting: Obstetrics & Gynecology

## 2018-11-29 DIAGNOSIS — Z3403 Encounter for supervision of normal first pregnancy, third trimester: Secondary | ICD-10-CM

## 2018-11-29 DIAGNOSIS — Z3A39 39 weeks gestation of pregnancy: Secondary | ICD-10-CM | POA: Diagnosis not present

## 2018-11-29 DIAGNOSIS — Z880 Allergy status to penicillin: Secondary | ICD-10-CM

## 2018-11-29 DIAGNOSIS — Z3A38 38 weeks gestation of pregnancy: Secondary | ICD-10-CM

## 2018-11-29 DIAGNOSIS — O134 Gestational [pregnancy-induced] hypertension without significant proteinuria, complicating childbirth: Secondary | ICD-10-CM | POA: Diagnosis not present

## 2018-11-29 DIAGNOSIS — O139 Gestational [pregnancy-induced] hypertension without significant proteinuria, unspecified trimester: Secondary | ICD-10-CM | POA: Diagnosis present

## 2018-11-29 DIAGNOSIS — O133 Gestational [pregnancy-induced] hypertension without significant proteinuria, third trimester: Secondary | ICD-10-CM

## 2018-11-29 LAB — CBC WITH DIFFERENTIAL/PLATELET
Basophils Absolute: 0 10*3/uL (ref 0.0–0.1)
Basophils Relative: 0 %
Eosinophils Absolute: 0.2 10*3/uL (ref 0.0–0.5)
Eosinophils Relative: 1 %
HCT: 43.8 % (ref 36.0–46.0)
Hemoglobin: 14.5 g/dL (ref 12.0–15.0)
Lymphocytes Relative: 19 %
Lymphs Abs: 2.9 10*3/uL (ref 0.7–4.0)
MCH: 29.2 pg (ref 26.0–34.0)
MCHC: 33.1 g/dL (ref 30.0–36.0)
MCV: 88.3 fL (ref 80.0–100.0)
Monocytes Absolute: 0.5 10*3/uL (ref 0.1–1.0)
Monocytes Relative: 3 %
Neutro Abs: 11.6 10*3/uL — ABNORMAL HIGH (ref 1.7–7.7)
Neutrophils Relative %: 77 %
Platelets: 245 10*3/uL (ref 150–400)
RBC: 4.96 MIL/uL (ref 3.87–5.11)
RDW: 15.6 % — ABNORMAL HIGH (ref 11.5–15.5)
WBC: 15.2 10*3/uL — ABNORMAL HIGH (ref 4.0–10.5)
nRBC: 0 % (ref 0.0–0.2)

## 2018-11-29 LAB — URINALYSIS, ROUTINE W REFLEX MICROSCOPIC
Bilirubin Urine: NEGATIVE
Glucose, UA: NEGATIVE mg/dL
Hgb urine dipstick: NEGATIVE
KETONES UR: NEGATIVE mg/dL
Nitrite: NEGATIVE
Protein, ur: NEGATIVE mg/dL
Specific Gravity, Urine: 1.018 (ref 1.005–1.030)
pH: 6 (ref 5.0–8.0)

## 2018-11-29 LAB — TYPE AND SCREEN
ABO/RH(D): O POS
Antibody Screen: NEGATIVE

## 2018-11-29 LAB — COMPREHENSIVE METABOLIC PANEL
ALK PHOS: 103 U/L (ref 38–126)
ALT: 15 U/L (ref 0–44)
AST: 26 U/L (ref 15–41)
Albumin: 3.1 g/dL — ABNORMAL LOW (ref 3.5–5.0)
Anion gap: 10 (ref 5–15)
BUN: 8 mg/dL (ref 6–20)
CALCIUM: 9.1 mg/dL (ref 8.9–10.3)
CO2: 22 mmol/L (ref 22–32)
Chloride: 106 mmol/L (ref 98–111)
Creatinine, Ser: 0.48 mg/dL (ref 0.44–1.00)
GFR calc Af Amer: >60 mL/min (ref >60)
GFR calc non Af Amer: >60 mL/min (ref >60)
GLUCOSE: 72 mg/dL (ref 70–99)
Potassium: 4.4 mmol/L (ref 3.5–5.1)
Sodium: 138 mmol/L (ref 135–145)
Total Bilirubin: 0.7 mg/dL (ref 0.3–1.2)
Total Protein: 6.7 g/dL (ref 6.5–8.1)

## 2018-11-29 LAB — PROTEIN / CREATININE RATIO, URINE
Creatinine, Urine: 132 mg/dL
PROTEIN CREATININE RATIO: 0.13 mg/mg{creat} (ref 0.00–0.15)
Total Protein, Urine: 17 mg/dL

## 2018-11-29 MED ORDER — OXYCODONE-ACETAMINOPHEN 5-325 MG PO TABS: 1.0000 | ORAL_TABLET | ORAL | Status: DC | PRN

## 2018-11-29 MED ORDER — SOD CITRATE-CITRIC ACID 500-334 MG/5ML PO SOLN: 30.0000 mL | ORAL | Status: DC | PRN

## 2018-11-29 MED ORDER — ACETAMINOPHEN 325 MG PO TABS: 650.0000 mg | ORAL_TABLET | ORAL | Status: DC | PRN

## 2018-11-29 MED ORDER — ONDANSETRON HCL 4 MG/2ML IJ SOLN
4.0000 mg | Freq: Four times a day (QID) | INTRAMUSCULAR | Status: DC | PRN
  Administered 2018-12-01: 4 mg via INTRAVENOUS
  Filled 2018-11-29: qty 2

## 2018-11-29 MED ORDER — OXYTOCIN BOLUS FROM INFUSION: 500.0000 mL | Freq: Once | INTRAVENOUS | Status: DC

## 2018-11-29 MED ORDER — OXYTOCIN 40 UNITS IN NORMAL SALINE INFUSION - SIMPLE MED: 2.5000 [IU]/h | INTRAVENOUS | Status: DC

## 2018-11-29 MED ORDER — LACTATED RINGERS IV SOLN
500.0000 mL | INTRAVENOUS | Status: DC | PRN
  Administered 2018-12-01 (×2): 500 mL via INTRAVENOUS

## 2018-11-29 MED ORDER — LACTATED RINGERS IV SOLN
INTRAVENOUS | Status: DC
  Administered 2018-11-29 – 2018-12-01 (×7): via INTRAVENOUS

## 2018-11-29 MED ORDER — LIDOCAINE HCL (PF) 1 % IJ SOLN: 30.0000 mL | INTRAMUSCULAR | Status: DC | PRN

## 2018-11-29 MED ORDER — OXYCODONE-ACETAMINOPHEN 5-325 MG PO TABS: 2.0000 | ORAL_TABLET | ORAL | Status: DC | PRN

## 2018-11-29 NOTE — MAU Note (Signed)
Ctxs off an on today. Saw blood on tissue when I wiped so came in.

## 2018-11-29 NOTE — Progress Notes (Signed)
OB/GYN Faculty Practice: Labor Progress Note  Subjective: Doing well. Denies HA, blurry vision, abdominal pain. Still feeling some contractions, not too uncomfortable.  Objective: BP (!) 141/86   Pulse 91   Temp 98.3 F (36.8 C)   Resp 18   Ht 5\' 1"  (1.549 m)   Wt 75.3 kg   LMP 03/03/2018   BMI 31.37 kg/m  Gen: well-appearing, NAD Dilation: 1(outer os. UTA inner os) Effacement (%): Thick Cervical Position: Posterior Presentation: Vertex(by bedside u/s) Exam by:: Sharen CounterLisa Leftwich-Kirby CNM  Assessment and Plan: 23 y.o. G1P0 7365w5d here for IOL for gHTN.  Labor: Expectant management until next cervical check then if unchanged will plan to place FB, start cytotec.  -- pain control: open to epidural but would prefer not having it -- PPH Risk: low  Fetal Well-Being: EFW 6-7lbs by Leopold's. Cephalic by prior checks.  -- Category I - continuous fetal monitoring  -- GBS negative    GHTN: BP normal to moderate range. UPC 0.13, labs wnl. Asymptomatic, continue to monitor.   Cristal DeerLaurel S. Earlene PlaterWallace, DO OB/GYN Fellow, Faculty Practice  11:38 PM

## 2018-11-29 NOTE — H&P (Signed)
Natalie Holmes is a 23 y.o. female G1P0 @[redacted]w[redacted]d  presenting for labor evaluation and vaginal bleeding.  Pt with HTN, 140s/90s in MAU with hx HTN at one previous visit. She also reports headaches intermittently starting this week, mostly right sided but bilateral frontal. There are no other symptoms.      Nursing Staff Provider  Office Location  [facility] Dating  LMP + 9 wk  Language  ENGLISH Anatomy US  WNL > FU normal with isolated EIF   Flu Vaccine  08/18/18 Genetic Screen  NIPS:  Low risk, female    TDaP vaccine   09/15/18 Hgb A1C or  GTT Early  Third trimester  Ref. Range 09/15/2018 08:48  Glucose, 1 hour Latest Ref Range: 65 - 179 mg/dL 161120  Glucose, Fasting Latest Ref Range: 65 - 91 mg/dL 75  Glucose, 2 hour Latest Ref Range: 65 - 152 mg/dL 98    Rhogam  N/A   LAB RESULTS   Feeding Plan Breast Blood Type O/Positive/-- (07/19 0000)   Contraception  depo vs nexplanon Antibody Negative (07/19 0000)  Circumcision Yes Rubella Immune (07/19 0000)  Pediatrician   has list RPR Nonreactive (07/19 0000)   Support Person Solomon (FOB) HBsAg Negative (07/19 0000)   Prenatal Classes  HIV Non-reactive (07/19 0000)  BTL Consent  GBS  negative  VBAC Consent  Pap 05/05/18 Normal    Hgb Electro      CF     SMA     Waterbirth  [ ]  Class [ ]  Consent [ ]  CNM visit     OB History    Gravida  1   Para      Term      Preterm      AB      Living        SAB      TAB      Ectopic      Multiple      Live Births             History reviewed. No pertinent past medical history. History reviewed. No pertinent surgical history. Family History: family history includes Healthy in her father and mother. Social History:  reports that she has never smoked. She has never used smokeless tobacco. She reports previous alcohol use of about 1.0 standard drinks of alcohol per week. She reports that she does not use drugs.     Maternal Diabetes: No Genetic Screening: Normal Maternal  Ultrasounds/Referrals: Normal Fetal Ultrasounds or other Referrals:  None Maternal Substance Abuse:  No Significant Maternal Medications:  None Significant Maternal Lab Results:  Lab values include: Group B Strep negative Other Comments:  None  Review of Systems  Constitutional: Negative for chills and fever.  Respiratory: Negative for shortness of breath.   Cardiovascular: Negative for chest pain.  Gastrointestinal: Positive for abdominal pain. Negative for constipation, diarrhea, nausea and vomiting.  Genitourinary:       Light vaginal bleeding  Neurological: Negative for dizziness and headaches.  All other systems reviewed and are negative.  Maternal Medical History:  Reason for admission: Nausea.      Blood pressure (!) 133/92, pulse 85, temperature 98.3 F (36.8 C), resp. rate 18, height 5\' 1"  (1.549 m), weight 75.3 kg, last menstrual period 03/03/2018. Exam Physical Exam  Prenatal labs: ABO, Rh: O/Positive/-- (07/19 0000) Antibody: Negative (07/19 0000) Rubella: Immune (07/19 0000) RPR: Non Reactive (11/11 0848)  HBsAg: Negative (07/19 0000)  HIV: Non Reactive (11/11 0848)  GBS:  Assessment/Plan: 23 y.o. G1P0 @[redacted]w[redacted]d  with gestational hypertension at term Intermittent headaches, preeclampsia labs pending GBS negative  Admit to [employer] Pt desires low intervention labor so consider expectant management if BP/labs stable Augmentation as needed if active labor does not begin Anticipate NSVD   Sharen Counter 11/29/2018, 9:22 PM

## 2018-11-29 NOTE — MAU Note (Signed)
Pt with elevated BP's today. Pt states," I had a really bad HA for one week that went away 2 days ago". Pt c/o sharp, shooting pain in right temporal on/off x 1 wk.  Adah Perl RN

## 2018-11-30 LAB — RPR: RPR Ser Ql: NONREACTIVE

## 2018-11-30 LAB — ABO/RH: ABO/RH(D): O POS

## 2018-11-30 MED ORDER — MISOPROSTOL 25 MCG QUARTER TABLET
25.0000 ug | ORAL_TABLET | ORAL | Status: DC
  Administered 2018-11-30: 25 ug via VAGINAL
  Filled 2018-11-30 (×2): qty 1

## 2018-11-30 MED ORDER — OXYTOCIN 40 UNITS IN NORMAL SALINE INFUSION - SIMPLE MED
1.0000 m[IU]/min | INTRAVENOUS | Status: DC
  Administered 2018-11-30 (×2): 2 m[IU]/min via INTRAVENOUS
  Filled 2018-11-30 (×2): qty 1000

## 2018-11-30 MED ORDER — TERBUTALINE SULFATE 1 MG/ML IJ SOLN: 0.2500 mg | Freq: Once | INTRAMUSCULAR | Status: DC | PRN

## 2018-11-30 MED ORDER — FENTANYL CITRATE (PF) 100 MCG/2ML IJ SOLN
100.0000 ug | INTRAMUSCULAR | Status: DC | PRN
  Administered 2018-11-30 (×3): 100 ug via INTRAVENOUS
  Filled 2018-11-30 (×3): qty 2

## 2018-11-30 MED ORDER — EPHEDRINE 5 MG/ML INJ: 10.0000 mg | INTRAVENOUS | Status: DC | PRN

## 2018-11-30 MED ORDER — MISOPROSTOL 50MCG HALF TABLET
50.0000 ug | ORAL_TABLET | ORAL | Status: DC | PRN
  Administered 2018-11-30 (×2): 50 ug via BUCCAL
  Filled 2018-11-30 (×2): qty 1

## 2018-11-30 MED ORDER — MISOPROSTOL 25 MCG QUARTER TABLET
25.0000 ug | ORAL_TABLET | ORAL | Status: DC | PRN
  Filled 2018-11-30: qty 1

## 2018-11-30 MED ORDER — PHENYLEPHRINE 40 MCG/ML (10ML) SYRINGE FOR IV PUSH (FOR BLOOD PRESSURE SUPPORT): 80.0000 ug | PREFILLED_SYRINGE | INTRAVENOUS | Status: DC | PRN

## 2018-11-30 MED ORDER — PHENYLEPHRINE 40 MCG/ML (10ML) SYRINGE FOR IV PUSH (FOR BLOOD PRESSURE SUPPORT)
80.0000 ug | PREFILLED_SYRINGE | INTRAVENOUS | Status: DC | PRN
  Filled 2018-11-30: qty 10

## 2018-11-30 MED ORDER — DIPHENHYDRAMINE HCL 25 MG PO CAPS
25.0000 mg | ORAL_CAPSULE | Freq: Four times a day (QID) | ORAL | Status: DC | PRN
  Administered 2018-11-30: 25 mg via ORAL
  Filled 2018-11-30: qty 1

## 2018-11-30 MED ORDER — DIPHENHYDRAMINE HCL 50 MG/ML IJ SOLN: 12.5000 mg | INTRAMUSCULAR | Status: DC | PRN

## 2018-11-30 MED ORDER — LACTATED RINGERS IV SOLN
500.0000 mL | Freq: Once | INTRAVENOUS | Status: AC
  Administered 2018-11-30: 500 mL via INTRAVENOUS

## 2018-11-30 MED ORDER — FENTANYL 2.5 MCG/ML BUPIVACAINE 1/10 % EPIDURAL INFUSION (WH - ANES)
14.0000 mL/h | INTRAMUSCULAR | Status: DC | PRN
  Administered 2018-12-01 (×2): 14 mL/h via EPIDURAL
  Filled 2018-11-30 (×2): qty 100

## 2018-11-30 NOTE — Progress Notes (Signed)
   Natalie Holmes is a 23 y.o. G1P0 at 2559w6d  [facility]   Subjective: Patient now feeling some contractions.   Objective: Vitals:   11/30/18 1731 11/30/18 1801 11/30/18 1831 11/30/18 1901  BP: 132/85 120/88 125/85 127/90  Pulse: 88 73 73 72  Resp: 20 20 20 18   Temp:    98.4 F (36.9 C)  TempSrc:    Oral  Weight:      Height:       No intake/output data recorded.  FHT:  FHR: 150 bpm, variability: moderate,  accelerations:  Present,  decelerations:  Absent UC:   irregular, every 2-3 minutes SVE:   Dilation: 4 Effacement (%): 50 Station: -3 Exam by:: Ardelle AntonJ. Follmer, RN  Labs: Lab Results  Component Value Date   WBC 15.2 (H) 11/29/2018   HGB 14.5 11/29/2018   HCT 43.8 11/29/2018   MCV 88.3 11/29/2018   PLT 245 11/29/2018    Assessment / Plan: Spontaneous labor, progressing normally  Labor: now with 3rd cytotec and starting to feel her contractions.  Fetal Wellbeing:  Category I Pain Control:  Labor support without medications Anticipated MOD:  NSVD  Marylene LandKathryn Lorraine Kooistra 11/30/2018, 7:40 PM

## 2018-11-30 NOTE — Anesthesia Pain Management Evaluation Note (Signed)
  CRNA Pain Management Visit Note  Patient: Natalie Holmes, 23 y.o., female  "Hello I am a member of the anesthesia team at [facility]. We have an anesthesia team available at all times to provide care throughout the hospital, including epidural management and anesthesia for C-section. I don't know your plan for the delivery whether it a natural birth, water birth, IV sedation, nitrous supplementation, doula or epidural, but we want to meet your pain goals."   1.Was your pain managed to your expectations on prior hospitalizations?   No prior hospitalizations  2.What is your expectation for pain management during this hospitalization?     Epidural  3.How can we help you reach that goal? Support prn  Record the patient's initial score and the patient's pain goal.   Pain: 5  Pain Goal: 2 [facility] wants you to be able to say your pain was always managed very well.  [facility] 11/30/2018

## 2018-11-30 NOTE — Progress Notes (Signed)
OB/GYN Faculty Practice: Labor Progress Note  Subjective: Natalie Holmes is a 23 y.o. female G1P0 @[redacted]w[redacted]d  admitted for IOL secondary to gHTN.   Doing well. Mild-moderate contraction pain.   Objective: BP 107/74   Pulse 85   Temp 98.4 F (36.9 C) (Oral)   Resp 18   Ht 5\' 1"  (1.549 m)   Wt 75.3 kg   LMP 03/03/2018   BMI 31.37 kg/m  Gen: alert and cooperative  Dilation: 4.5 Effacement: 80% Station -2  Assessment and Plan: Natalie Holmes is a 23 y.o. female G1P0 @[redacted]w[redacted]d  admitted for IOL secondary to gHTN.   Labor: latent -- pain control: natural --will start pitocin   Fetal Well-Being:  -- Category 1 -- GBS (-)   Rollene Rotunda, DO PGYI Family Medicine  5:08 AM

## 2018-11-30 NOTE — Progress Notes (Signed)
LABOR PROGRESS NOTE  Natalie Holmes is a 23 y.o. G1P0 at [redacted]w[redacted]d  admitted for IOL for gHTN.   Subjective: Doing well, coping with contractions. Plans for IV pain medication, however currently coping well.   Objective: BP 127/75   Pulse 60   Temp 98.4 F (36.9 C) (Oral)   Resp 18   Ht 5\' 1"  (1.549 m)   Wt 75.3 kg   LMP 03/03/2018   BMI 31.37 kg/m  or  Vitals:   11/30/18 1831 11/30/18 1901 11/30/18 1933 11/30/18 2010  BP: 125/85 127/90 122/84 127/75  Pulse: 73 72 81 60  Resp: 20 18 18    Temp:  98.4 F (36.9 C) 98.4 F (36.9 C)   TempSrc:  Oral Oral   Weight:      Height:        2045 Dilation: 4 Effacement (%): 50 Cervical Position: Posterior Station: -3 Presentation: Vertex Exam by:: CNM St FHT: baseline rate 155, moderate varibility, +acel, no decel Toco: every 2-5 minutes lasting 50-80 seconds  Labs: Lab Results  Component Value Date   WBC 15.2 (H) 11/29/2018   HGB 14.5 11/29/2018   HCT 43.8 11/29/2018   MCV 88.3 11/29/2018   PLT 245 11/29/2018    Patient Active Problem List   Diagnosis Date Noted  . Gestational hypertension 11/29/2018  . Penicillin allergy 11/11/2018  . Encounter for supervision of normal first pregnancy in third trimester 08/18/2018    Assessment / Plan: 23 y.o. G1P0 at [redacted]w[redacted]d here for IOL for gHTN.   GHTN: PEC labs wnl, BP's [id number] GBS negative   Labor: s/p cytotec X3, last cytotec at 1900, pt more uncomfortable. Will recheck at 2300 and make decision for cytotec or pitocin based on cervical exam.  Fetal Wellbeing:  Category 1 Pain Control:  IV fentanyl if needed Anticipated MOD:  SVD  Harveen Flesch SNM  11/30/2018, 8:53 PM

## 2018-11-30 NOTE — Progress Notes (Signed)
LABOR PROGRESS NOTE  Natalie Holmes is a 23 y.o. G1P0 at [redacted]w[redacted]d  admitted for IOL for gHTN.  Subjective: Doing well. IV fentanyl is helping her cope with contractions.   Objective: BP 113/77   Pulse 79   Temp 98.4 F (36.9 C) (Oral)   Resp 18   Ht 5\' 1"  (1.549 m)   Wt 75.3 kg   LMP 03/03/2018   BMI 31.37 kg/m  or  Vitals:   11/30/18 2131 11/30/18 2201 11/30/18 2231 11/30/18 2301  BP: 126/89 120/76 122/74 113/77  Pulse: (!) 110 72 72 79  Resp:      Temp:      TempSrc:      Weight:      Height:        2300 Dilation: 5 Effacement (%): 80 Cervical Position: Posterior Station: -3 Presentation: Vertex Exam by:: CNM Boss Danielsen FHT: baseline rate 135, moderate varibility, +acel, no decel Toco: every 2-7 minutes lasting 60-100 seconds  Labs: Lab Results  Component Value Date   WBC 15.2 (H) 11/29/2018   HGB 14.5 11/29/2018   HCT 43.8 11/29/2018   MCV 88.3 11/29/2018   PLT 245 11/29/2018    Patient Active Problem List   Diagnosis Date Noted  . Gestational hypertension 11/29/2018  . Penicillin allergy 11/11/2018  . Encounter for supervision of normal first pregnancy in third trimester 08/18/2018    Assessment / Plan: 24 y.o. G1P0 at [redacted]w[redacted]d here for ION for gHTN   GHTN: mild range Bps, continue to monitor  Labor: s/p cytotec X3, given cervical ripening/dilation begin titration of pitocin Fetal Wellbeing:  Category 1 Pain Control:  IV fentanyl  Anticipated MOD:  SVD  Legacy Lacivita, SNM  11/30/2018, 11:14 PM

## 2018-11-30 NOTE — Progress Notes (Signed)
OB/GYN Faculty Practice: Labor Progress Note  Subjective: Natalie Holmes is a 23 y.o. female G1P0 @[redacted]w[redacted]d  admitted for IOL secondary to gHTN.   Mild-moderate discomfort pelvis/abdominal/back, otherwise asymptomatic. Reiterates desire to have a natural birth.   Objective: BP (!) 141/86   Pulse 91   Temp 98.4 F (36.9 C) (Oral)   Resp 18   Ht 5\' 1"  (1.549 m)   Wt 75.3 kg   LMP 03/03/2018   BMI 31.37 kg/m  Gen: alert and cooperative  Dilation: 2 Effacement: 50% Station -2  Assessment and Plan: Natalie Holmes is a 23 y.o. female G1P0 @[redacted]w[redacted]d  admitted for IOL secondary to gHTN.   Labor: latent -- pain control: natural -will hold off on cytotec for now as patient has made some change and has not had severe blood pressures. However, if minimal progress is made after the next couple of hours, will augment with cytotec as patient is being induced for a medical reason. Will recheck about 0330.   Fetal Well-Being:  -- Category 1 -- GBS (-)   Rollene Rotunda, DO PGYI Family Medicine  1:44 AM

## 2018-11-30 NOTE — Progress Notes (Addendum)
LABOR PROGRESS NOTE  Sorah Blodgett is a 23 y.o. G1P0 at [redacted]w[redacted]d admitted for IOL for gHTN.  Subjective: Patient is seen resting in bed surrounded by supportive, fun family members. She has been ambulating and sitting on the yoga ball. She is feeling a little discouraged that she is not progressing faster but is otherwise upbeat and cheerful. She reports feeling fetal movement and denies leakage of fluid and vaginal bleeding.  Objective: BP 127/90   Pulse 72   Temp 98.4 F (36.9 C) (Oral)   Resp 18   Ht 5\' 1"  (1.549 m)   Wt 75.3 kg   LMP 03/03/2018   BMI 31.37 kg/m  or  Vitals:   11/30/18 1731 11/30/18 1801 11/30/18 1831 11/30/18 1901  BP: 132/85 120/88 125/85 127/90  Pulse: 88 73 73 72  Resp: 20 20 20 18   Temp:    98.4 F (36.9 C)  TempSrc:    Oral  Weight:      Height:        Dilation: 4 Effacement (%): 50 Cervical Position: Posterior Station: -3 Presentation: Vertex Exam by:: Ardelle Anton, RN FHT: baseline rate 150, moderate varibility, 15x15 accel, no decel Toco: Pit > Cytotec x 3  Labs: Lab Results  Component Value Date   WBC 15.2 (H) 11/29/2018   HGB 14.5 11/29/2018   HCT 43.8 11/29/2018   MCV 88.3 11/29/2018   PLT 245 11/29/2018    Patient Active Problem List   Diagnosis Date Noted  . Gestational hypertension 11/29/2018  . Penicillin allergy 11/11/2018  . Encounter for supervision of normal first pregnancy in third trimester 08/18/2018    Assessment / Plan: 23 y.o. G1P0 at [redacted]w[redacted]d here for IOL for gHTN.  Labor: Latent, so far Pit stopped and 3 cytotec given. Fetal Wellbeing:  Category 1 Pain Control:  Most likely planning for epidural Anticipated MOD:  Vaginal   Peggyann Shoals, DO [facility] Family Medicine, PGY-1 11/30/2018 7:32 PM   I confirm that I have verified the information documented in the resident's note and that I have also personally reperformed the physical exam and all medical decision making activities.  Patient doing well;  feeling more contractions. Wants epidural before she has her water broken.  Cat 1 tracing; continue expectant management.  Luna Kitchens

## 2018-11-30 NOTE — Progress Notes (Signed)
   Natalie Holmes is a 23 y.o. G1P0 at [redacted]w[redacted]d  admitted for induction of labor due to Hypertension.  Subjective:  Coping well; has been playing cards in bed.  Objective: Vitals:   11/30/18 1234 11/30/18 1316 11/30/18 1331 11/30/18 1401  BP: 117/76 123/74 125/82 139/89  Pulse: 97 75 84 84  Resp: 16 16 18 20   Temp:      TempSrc:      Weight:      Height:       No intake/output data recorded.  FHT:  FHR: 150 bpm, variability: moderate,  accelerations:  Present,  decelerations:  Present variables UC:   irregular, every 5 minutes SVE:   Dilation: 4 Effacement (%): 50 Station: -3 Exam by:: Crisoforo Oxford, CNM Patient was 3-4 internal os at 11AM this morning, thick, and posterior and -3, now less posterior but essentially unchanged. Received cytotec buccally; will given another cytotec buccally and reassess in 4 hours.   Labs: Lab Results  Component Value Date   WBC 15.2 (H) 11/29/2018   HGB 14.5 11/29/2018   HCT 43.8 11/29/2018   MCV 88.3 11/29/2018   PLT 245 11/29/2018    Assessment / Plan: Protracted latent phase Blood pressure has been WNL today.   Labor: no progress on pitocin, now will cytotec for ripening.  Fetal Wellbeing:  Category I Pain Control:  Epidural Anticipated MOD:  NSVD  Natalie Holmes Natalie Holmes 11/30/2018, 2:54 PM

## 2018-11-30 NOTE — Progress Notes (Deleted)
   Natalie Holmes is a 23 y.o. G1P0 at [redacted]w[redacted]d  admitted for IOL for GHTN.   Subjective:   Objective: There were no vitals filed for this visit. @IOTHISSHIFT @  FHT:  FHR: *** bpm, variability: {fhr variability:21617},  accelerations:  {Accelerations:21618},  decelerations:  {fhr decel present:21619} UC:   {obgyn contractions reg/irreg:312982} SVE:     Pitocin @ *** mu/min  Labs: Lab Results  Component Value Date   WBC 15.2 (H) 11/29/2018   HGB 14.5 11/29/2018   HCT 43.8 11/29/2018   MCV 88.3 11/29/2018   PLT 245 11/29/2018    Assessment / Plan: Cervix  is long, posterior, internal feels like 3 but external os is 4.   Labor: {CHL LABOR PROGRESS:21622} Fetal Wellbeing:  {CHL FWB PLAN:21624} Pain Control:  {CHL FWB ZOXW:96045} Anticipated MOD:  {WUJ:81191}  Natalie Holmes 11/30/2018, 11:17 AM

## 2018-12-01 ENCOUNTER — Inpatient Hospital Stay (HOSPITAL_COMMUNITY): Payer: Medicaid Other | Admitting: Anesthesiology

## 2018-12-01 ENCOUNTER — Encounter: Payer: Self-pay | Admitting: Advanced Practice Midwife

## 2018-12-01 ENCOUNTER — Encounter (HOSPITAL_COMMUNITY): Payer: Self-pay | Admitting: Family Medicine

## 2018-12-01 DIAGNOSIS — Z3A38 38 weeks gestation of pregnancy: Secondary | ICD-10-CM

## 2018-12-01 DIAGNOSIS — O134 Gestational [pregnancy-induced] hypertension without significant proteinuria, complicating childbirth: Secondary | ICD-10-CM

## 2018-12-01 LAB — CBC
HCT: 41 % (ref 36.0–46.0)
HEMATOCRIT: 42.8 % (ref 36.0–46.0)
Hemoglobin: 13.5 g/dL (ref 12.0–15.0)
Hemoglobin: 14.2 g/dL (ref 12.0–15.0)
MCH: 28.9 pg (ref 26.0–34.0)
MCH: 29 pg (ref 26.0–34.0)
MCHC: 32.9 g/dL (ref 30.0–36.0)
MCHC: 33.2 g/dL (ref 30.0–36.0)
MCV: 87.3 fL (ref 80.0–100.0)
MCV: 87.8 fL (ref 80.0–100.0)
NRBC: 0 % (ref 0.0–0.2)
Platelets: 204 10*3/uL (ref 150–400)
Platelets: 215 10*3/uL (ref 150–400)
RBC: 4.67 MIL/uL (ref 3.87–5.11)
RBC: 4.9 MIL/uL (ref 3.87–5.11)
RDW: 15.7 % — ABNORMAL HIGH (ref 11.5–15.5)
RDW: 15.6 % — AB (ref 11.5–15.5)
WBC: 15.6 10*3/uL — ABNORMAL HIGH (ref 4.0–10.5)
WBC: 22.1 10*3/uL — ABNORMAL HIGH (ref 4.0–10.5)
nRBC: 0 % (ref 0.0–0.2)

## 2018-12-01 MED ORDER — METHYLERGONOVINE MALEATE 0.2 MG/ML IJ SOLN
0.2000 mg | Freq: Once | INTRAMUSCULAR | Status: AC
  Administered 2018-12-01: 0.2 mg via INTRAMUSCULAR

## 2018-12-01 MED ORDER — ONDANSETRON HCL 4 MG/2ML IJ SOLN: 4.0000 mg | INTRAMUSCULAR | Status: DC | PRN

## 2018-12-01 MED ORDER — ACETAMINOPHEN 325 MG PO TABS
650.0000 mg | ORAL_TABLET | ORAL | Status: DC | PRN
  Administered 2018-12-01: 650 mg via ORAL
  Filled 2018-12-01: qty 2

## 2018-12-01 MED ORDER — MISOPROSTOL 200 MCG PO TABS
ORAL_TABLET | ORAL | Status: AC
  Filled 2018-12-01: qty 4

## 2018-12-01 MED ORDER — TRANEXAMIC ACID-NACL 1000-0.7 MG/100ML-% IV SOLN
INTRAVENOUS | Status: AC
  Administered 2018-12-01: 1000 mg via INTRAVENOUS
  Filled 2018-12-01: qty 100

## 2018-12-01 MED ORDER — COCONUT OIL OIL
1.0000 "application " | TOPICAL_OIL | Status: DC | PRN
  Filled 2018-12-01 (×2): qty 120

## 2018-12-01 MED ORDER — SENNOSIDES-DOCUSATE SODIUM 8.6-50 MG PO TABS
2.0000 | ORAL_TABLET | ORAL | Status: DC
  Administered 2018-12-01 – 2018-12-02 (×2): 2 via ORAL
  Filled 2018-12-01 (×2): qty 2

## 2018-12-01 MED ORDER — BENZOCAINE-MENTHOL 20-0.5 % EX AERO
1.0000 "application " | INHALATION_SPRAY | CUTANEOUS | Status: DC | PRN
  Administered 2018-12-01: 1 via TOPICAL
  Filled 2018-12-01 (×2): qty 56

## 2018-12-01 MED ORDER — MISOPROSTOL 200 MCG PO TABS
800.0000 ug | ORAL_TABLET | Freq: Once | ORAL | Status: AC
  Administered 2018-12-01: 800 ug via BUCCAL

## 2018-12-01 MED ORDER — WITCH HAZEL-GLYCERIN EX PADS: 1.0000 "application " | MEDICATED_PAD | CUTANEOUS | Status: DC | PRN

## 2018-12-01 MED ORDER — METHYLERGONOVINE MALEATE 0.2 MG/ML IJ SOLN
INTRAMUSCULAR | Status: AC
  Filled 2018-12-01: qty 1

## 2018-12-01 MED ORDER — OXYCODONE HCL 5 MG PO TABS: 5.0000 mg | ORAL_TABLET | ORAL | Status: DC | PRN

## 2018-12-01 MED ORDER — DIBUCAINE 1 % RE OINT
1.0000 "application " | TOPICAL_OINTMENT | RECTAL | Status: DC | PRN
  Filled 2018-12-01: qty 28

## 2018-12-01 MED ORDER — DIPHENHYDRAMINE HCL 25 MG PO CAPS: 25.0000 mg | ORAL_CAPSULE | Freq: Four times a day (QID) | ORAL | Status: DC | PRN

## 2018-12-01 MED ORDER — TRANEXAMIC ACID-NACL 1000-0.7 MG/100ML-% IV SOLN
1000.0000 mg | INTRAVENOUS | Status: AC
  Administered 2018-12-01: 1000 mg via INTRAVENOUS

## 2018-12-01 MED ORDER — TETANUS-DIPHTH-ACELL PERTUSSIS 5-2.5-18.5 LF-MCG/0.5 IM SUSP: 0.5000 mL | Freq: Once | INTRAMUSCULAR | Status: DC

## 2018-12-01 MED ORDER — IBUPROFEN 600 MG PO TABS
600.0000 mg | ORAL_TABLET | Freq: Four times a day (QID) | ORAL | Status: DC
  Administered 2018-12-01 – 2018-12-03 (×9): 600 mg via ORAL
  Filled 2018-12-01 (×9): qty 1

## 2018-12-01 MED ORDER — ZOLPIDEM TARTRATE 5 MG PO TABS: 5.0000 mg | ORAL_TABLET | Freq: Every evening | ORAL | Status: DC | PRN

## 2018-12-01 MED ORDER — SIMETHICONE 80 MG PO CHEW: 80.0000 mg | CHEWABLE_TABLET | ORAL | Status: DC | PRN

## 2018-12-01 MED ORDER — PRENATAL MULTIVITAMIN CH
1.0000 | ORAL_TABLET | Freq: Every day | ORAL | Status: DC
  Administered 2018-12-01 – 2018-12-03 (×3): 1 via ORAL
  Filled 2018-12-01 (×3): qty 1

## 2018-12-01 MED ORDER — LIDOCAINE-EPINEPHRINE (PF) 2 %-1:200000 IJ SOLN
INTRAMUSCULAR | Status: DC | PRN
  Administered 2018-12-01: 5 mL via EPIDURAL
  Administered 2018-12-01: 2 mL via EPIDURAL

## 2018-12-01 MED ORDER — ONDANSETRON HCL 4 MG PO TABS
4.0000 mg | ORAL_TABLET | ORAL | Status: DC | PRN
  Filled 2018-12-01: qty 1

## 2018-12-01 NOTE — Progress Notes (Signed)
LABOR PROGRESS NOTE  Natalie Holmes is a 23 y.o. G1P0 at [redacted]w[redacted]d  admitted for IOL for gHTN  Subjective: Doing well, resting comfortably with epidural    Objective: BP 119/73   Pulse 68   Temp 98.4 F (36.9 C) (Oral)   Resp 18   Ht 5\' 1"  (1.549 m)   Wt 75.3 kg   LMP 03/03/2018   SpO2 96%   BMI 31.37 kg/m  or  Vitals:   12/01/18 0121 12/01/18 0126 12/01/18 0130 12/01/18 0131  BP: 117/75  119/73 119/73  Pulse: 69  68 68  Resp:      Temp:      TempSrc:      SpO2: 96% 96% 96% 96%  Weight:      Height:        0255 Dilation: 7 Effacement (%): 80 Cervical Position: Posterior Station: -2 Presentation: Vertex Exam by:: CNM Destanie Tibbetts  Bulging bag  FHT: baseline rate 145, moderate varibility, no acel, early decel Toco: every  2-4, lasting 50-60 seconds  Pitocin: 10 mu/min   Labs: Lab Results  Component Value Date   WBC 15.6 (H) 12/01/2018   HGB 14.2 12/01/2018   HCT 42.8 12/01/2018   MCV 87.3 12/01/2018   PLT 215 12/01/2018    Patient Active Problem List   Diagnosis Date Noted  . Gestational hypertension 11/29/2018  . Penicillin allergy 11/11/2018  . Encounter for supervision of normal first pregnancy in third trimester 08/18/2018    Assessment / Plan: 23 y.o. G1P0 at [redacted]w[redacted]d here for IOL for gHTN   GHTN: mild range BP's, continue to monitor   Labor: progressing as expected, continue on pitocin and titrate as needed to achieve adequate contractions. Will recheck in 2 hours  Fetal Wellbeing:  Category 1  Pain Control:  Epidural  Anticipated MOD:  SVD  [place] Kirchner  OB Fellow  12/01/2018, 2:54 AM

## 2018-12-01 NOTE — Discharge Summary (Signed)
OB Discharge Summary     Patient Name: Natalie Holmes DOB: [birth year] MRN: [id number]  Date of admission: 11/29/2018 Delivering MD: Peggyann ShoalsANDERSON, HANNAH C   Date of discharge: 12/03/2018  Admitting diagnosis: 1138 WKS, CTX Intrauterine pregnancy: 6119w0d     Secondary diagnosis:  Principal Problem:   Gestational hypertension  Additional problems: None     Discharge diagnosis: Term Pregnancy Delivered and Gestational Hypertension                                                                                                Post partum procedures: none  Augmentation: AROM, Pitocin and Cytotec  Complications: None  Hospital course:  Onset of Labor With Vaginal Delivery     23 y.o. yo G1P1001 at 319w0d was evaluated for labor and was found to have gestational hypertension. She was admitted to the labor floor on 1/25/2020for IOL due to gestational hypertension. Patient had an uncomplicated labor course as follows:  Membrane Rupture Time/Date: 6:17 AM ,12/01/2018   Intrapartum Procedures: Episiotomy: None [1]                                         Lacerations:  2nd degree [3];Perineal [11]  Patient had a delivery of a Viable infant. 12/01/2018  Information for the patient's newborn:  Candelaria CelesteMorris, Girl Maylani [[id number]][[id number]]  Delivery Method: Vaginal, Spontaneous(Filed from Delivery Summary)    Pateint had an uncomplicated postpartum course.  She is ambulating, tolerating a regular diet, passing flatus, and urinating well. Patient is discharged home in stable condition on 12/03/18.   Physical exam  Vitals:   12/02/18 0530 12/02/18 1426 12/02/18 2109 12/03/18 0600  BP: 106/71 128/85 120/83 123/83  Pulse: 70 88 78 87  Resp: 16 18 18 18   Temp: 97.7 F (36.5 C) 98.4 F (36.9 C) 98.2 F (36.8 C) 97.8 F (36.6 C)  TempSrc: Oral Oral Oral Oral  SpO2: 99% 99%  97%  Weight:      Height:       General: alert, cooperative and no distress Lochia: appropriate Uterine Fundus: firm Incision:  N/A DVT Evaluation: No evidence of DVT seen on physical exam. Labs: Lab Results  Component Value Date   WBC 17.7 (H) 12/02/2018   HGB 10.5 (L) 12/02/2018   HCT 32.3 (L) 12/02/2018   MCV 87.8 12/02/2018   PLT 171 12/02/2018   CMP Latest Ref Rng & Units 11/29/2018  Glucose 70 - 99 mg/dL 72  BUN 6 - 20 mg/dL 8  Creatinine 0.860.44 - 5.781.00 mg/dL 4.690.48  Sodium 629135 - 528145 mmol/L 138  Potassium 3.5 - 5.1 mmol/L 4.4  Chloride 98 - 111 mmol/L 106  CO2 22 - 32 mmol/L 22  Calcium 8.9 - 10.3 mg/dL 9.1  Total Protein 6.5 - 8.1 g/dL 6.7  Total Bilirubin 0.3 - 1.2 mg/dL 0.7  Alkaline Phos 38 - 126 U/L 103  AST 15 - 41 U/L 26  ALT 0 - 44 U/L 15    Discharge instruction: per After Visit  Summary and "Baby and Me Booklet".  After visit meds:  Allergies as of 12/03/2018      Reactions   Penicillins Hives   Did it involve swelling of the face/tongue/throat, SOB, or low BP? Unknown Did it involve sudden or severe rash/hives, skin peeling, or any reaction on the inside of your mouth or nose? No Did you need to seek medical attention at a hospital or doctor's office? No When did it last happen?2 years ago If all above answers are "NO", may proceed with cephalosporin use.      Medication List    TAKE these medications   CONCEPT OB 130-92.4-1 MG Caps Take 1 capsule by mouth daily.   ferrous sulfate 325 (65 FE) MG tablet Take 1 tablet (325 mg total) by mouth daily. ferrous sulfate 325 mg (65 mg iron) tablet  Take 1 tablet twice a day by oral route for 30 days.   ibuprofen 600 MG tablet Commonly known as:  ADVIL,MOTRIN Take 1 tablet (600 mg total) by mouth every 6 (six) hours.       Diet: low salt diet  Activity: Advance as tolerated. Pelvic rest for 6 weeks.   Outpatient follow up:4 weeks Follow up Appt: Future Appointments  Date Time Provider Department Center  01/01/2019 10:55 AM Rasch, Harolyn RutherfordJennifer I, NP [facility]   Follow up Visit:No follow-ups on file. Please schedule this  patient for Postpartum visit in: 6 weeks with the following provider: Any provider For C/S patients schedule nurse incision check in weeks 2 weeks: no Low risk pregnancy complicated by: None Delivery mode:  SVD Anticipated Birth Control:  Nexplanon PP Procedures needed: None  Schedule Integrated BH visit: no   Postpartum contraception: Nexplanon or Depo  Newborn Data: Live born female  Birth Weight:   APGAR: 8,   Newborn Delivery   Birth date/time:  12/01/2018 09:38:00 Delivery type:  Vaginal, Spontaneous     Baby Feeding: Breast Disposition:home with mother   12/03/2018 Levie HeritageJacob J Stinson, DO

## 2018-12-01 NOTE — Anesthesia Preprocedure Evaluation (Signed)
Anesthesia Evaluation  Patient identified by MRN, date of birth, ID band Patient awake    Reviewed: Allergy & Precautions, H&P , NPO status , Patient's Chart, lab work & pertinent test results  History of Anesthesia Complications Negative for: history of anesthetic complications  Airway Mallampati: II  TM Distance: >3 FB Neck ROM: full    Dental no notable dental hx.    Pulmonary neg pulmonary ROS,    Pulmonary exam normal        Cardiovascular hypertension (gestational), Normal cardiovascular exam Rhythm:regular Rate:Normal     Neuro/Psych negative neurological ROS  negative psych ROS   GI/Hepatic negative GI ROS, Neg liver ROS,   Endo/Other  negative endocrine ROS  Renal/GU negative Renal ROS  negative genitourinary   Musculoskeletal   Abdominal   Peds  Hematology negative hematology ROS (+)   Anesthesia Other Findings   Reproductive/Obstetrics (+) Pregnancy                             Anesthesia Physical Anesthesia Plan  ASA: II  Anesthesia Plan: Epidural   Post-op Pain Management:    Induction:   PONV Risk Score and Plan:   Airway Management Planned:   Additional Equipment:   Intra-op Plan:   Post-operative Plan:   Informed Consent: I have reviewed the patients History and Physical, chart, labs and discussed the procedure including the risks, benefits and alternatives for the proposed anesthesia with the patient or authorized representative who has indicated his/her understanding and acceptance.       Plan Discussed with:   Anesthesia Plan Comments:         Anesthesia Quick Evaluation  

## 2018-12-01 NOTE — Lactation Note (Signed)
This note was copied from a baby's chart. Lactation Consultation Note  Patient Name: Natalie Holmes WGNFA'O Date: 12/01/2018  G1P1 svd of baby Natalie Alena at 5.4oz now 14 hours old. Parents with no breastfeeding education.  First blood sugar was 23.  Mom wants asistance with feeding. Assist with massage and hand expression.  Mom unable to hand express anything. Assisted mom with positioning and latching infant. Infant has a space between upper gums.  Mom with short some what wide nipples that almost appear bifurcated.  Mom has noteable scrars from nipple piercings.  Tissue on the right areola and nipple is not very compressable. . Discussed shells with mom but she reports she may start them tomorrow. Could not get infant to latch to right breast.  Infant will latch on left but comes off and on.  After a few attempts mom reports discomfort.  Infant still cuing.  Initiated using 24 mm nipple shield on left breast.  Infant will maintain and suck a few times and just hold in mouth/  Assist with prefilling nipple shield on right breast.  Urged mom to hold her breast while using a nipple shield.  infant latched and had rythmic sucking.  Used 5 french syringe and syringe and added 5 ml of neosure infant formula inside of shield.  Infant did a few more sucks past the formula but just held in mouth and let go.Discussed   Intitiated pumping with mom.  Showed mom how to use pump in inititiate setting.  Left mom and baby STS.  Enc to feed on cue and 8 or more times day.  Urged mom to call lactation as needed.   Maternal Data    Feeding    LATCH Score                   Interventions    Lactation Tools Discussed/Used     Consult Status      Natalie Holmes 12/01/2018, 10:35 PM

## 2018-12-01 NOTE — Anesthesia Postprocedure Evaluation (Signed)
Anesthesia Post Note  Patient: Natalie Holmes  Procedure(s) Performed: AN AD HOC LABOR EPIDURAL     Patient location during evaluation: Mother Baby Anesthesia Type: Epidural Level of consciousness: awake and alert and oriented Pain management: satisfactory to patient Vital Signs Assessment: post-procedure vital signs reviewed and stable Respiratory status: respiratory function stable Cardiovascular status: stable Postop Assessment: no headache, no backache, epidural receding, patient able to bend at knees, no signs of nausea or vomiting and adequate PO intake Anesthetic complications: no    Last Vitals:  Vitals:   12/01/18 1155 12/01/18 1300  BP: 125/82 121/77  Pulse: (!) 58 (!) 56  Resp: 18 18  Temp: (!) 38 C 37.9 C  SpO2: 100% 99%    Last Pain:  Vitals:   12/01/18 1300  TempSrc: Oral  PainSc: 0-No pain   Pain Goal: Patients Stated Pain Goal: 10 (11/30/18 2350)                 Katerine Morua

## 2018-12-01 NOTE — Anesthesia Procedure Notes (Signed)
Epidural Patient location during procedure: OB Start time: 12/01/2018 12:18 AM End time: 12/01/2018 12:29 AM  Staffing Anesthesiologist: Lucretia Kern, MD Performed: anesthesiologist   Preanesthetic Checklist Completed: patient identified, pre-op evaluation, timeout performed, IV checked, risks and benefits discussed and monitors and equipment checked  Epidural Patient position: sitting Prep: DuraPrep Patient monitoring: heart rate, continuous pulse ox and blood pressure Approach: midline Location: L2-L3 Injection technique: LOR air  Needle:  Needle type: Tuohy  Needle gauge: 17 G Needle length: 9 cm Needle insertion depth: 5 cm Catheter type: closed end flexible Catheter size: 19 Gauge Catheter at skin depth: 10 cm  Assessment Events: blood not aspirated, injection not painful, no injection resistance, negative IV test and no paresthesia  Additional Notes Reason for block:procedure for pain

## 2018-12-01 NOTE — Progress Notes (Signed)
LABOR PROGRESS NOTE  Natalie Holmes is a 23 y.o. G1P0 at [redacted]w[redacted]d  admitted for IOL for gHTN  Subjective: Doing well, feeling increased vaginal pressure   Objective: BP 112/78   Pulse 88   Temp 99.2 F (37.3 C) (Oral)   Resp 18   Ht 5\' 1"  (1.549 m)   Wt 75.3 kg   LMP 03/03/2018   SpO2 96%   BMI 31.37 kg/m  or  Vitals:   12/01/18 0431 12/01/18 0501 12/01/18 0531 12/01/18 0601  BP: 119/90 123/84 126/80 112/78  Pulse: (!) 148 80 91 88  Resp:      Temp:      TempSrc:      SpO2:      Weight:      Height:        0615 Dilation: 8 Effacement (%): 90 Cervical Position: Posterior Station: 0 Presentation: Vertex Exam by:: CNM Nitza Schmid  AROM: moderate mec stained fluid, malodorous  FHT: baseline rate 150, moderate varibility, no acel, no decel Toco: every 2-3 minutes lasting 50-60 seconds   Labs: Lab Results  Component Value Date   WBC 15.6 (H) 12/01/2018   HGB 14.2 12/01/2018   HCT 42.8 12/01/2018   MCV 87.3 12/01/2018   PLT 215 12/01/2018    Patient Active Problem List   Diagnosis Date Noted  . Gestational hypertension 11/29/2018  . Penicillin allergy 11/11/2018  . Encounter for supervision of normal first pregnancy in third trimester 08/18/2018    Assessment / Plan: 23 y.o. G1P0 at [redacted]w[redacted]d here for IOL for gHTN   GHTN: mild range pressures, continue to monitor  Labor: progressing as expected, pt AROM'd. mec stained fluid noted. Recheck in 2 hours  Fetal Wellbeing:  Category 1 Pain Control:  Epidural  Anticipated MOD:  SVD  Gabryel Talamo, SNM  12/01/2018, 6:23 AM

## 2018-12-02 LAB — CBC
HEMATOCRIT: 32.3 % — AB (ref 36.0–46.0)
HEMOGLOBIN: 10.5 g/dL — AB (ref 12.0–15.0)
MCH: 28.5 pg (ref 26.0–34.0)
MCHC: 32.5 g/dL (ref 30.0–36.0)
MCV: 87.8 fL (ref 80.0–100.0)
Platelets: 171 10*3/uL (ref 150–400)
RBC: 3.68 MIL/uL — ABNORMAL LOW (ref 3.87–5.11)
RDW: 15.7 % — ABNORMAL HIGH (ref 11.5–15.5)
WBC: 17.7 10*3/uL — ABNORMAL HIGH (ref 4.0–10.5)
nRBC: 0 % (ref 0.0–0.2)

## 2018-12-02 NOTE — Lactation Note (Signed)
This note was copied from a baby's chart. Lactation Consultation Note  Patient Name: Natalie Holmes EAVWU'J Date: 12/02/2018 Reason for consult: Follow-up assessment;1st time breastfeeding;Primapara;Difficult latch  1025 - I visited [name] to check on her progress with breast feeding. She was pumping with size 27 flanges at time of entry. Dad was providing baby with a bottle of formula. Mom and dad state that they put baby "Natalie Holmes" to the breast first using a size 24 nipple shield and a feeding tube/syringe and that baby transferred 6 cc's by feeding tube on her own (without pushing the formula). Baby then became tired. Her nipples are flat, but they appeared in tact.  Dad then gave baby formula by bottle while mom pumped.  I recommended that mom continue to pump 8x/day using the initiation setting. I showed dad how to pace bottle feed baby and check for satiation. I answered questions about feeding volumes and how to know if baby is getting enough to eat. Mom and dad have their feeding volume worksheet and I reviewed it.  I encouraged mom to keep pumping and discussed normal pumping volumes in the first days and when to expect mature milk to come in. I also encouraged her to continue to put baby to breast as much as possible.  We discussed gentle waking/"pestering" practices while breast feeding to keep her active.   Mom had WIC in [facility] but does not currently have WIC locally. I discussed our rental pump option, and she expressed interest. I also indicated that I would make a [facility] referral for her, and she verbalized agreement.   Mom and dad are quite dedicated to breast feeding and pumping. We agreed that her plan may adapt in the coming days as her milk transitions and as baby learns how to suckle at the breast more effectively. No further questions at this time.    Maternal Data Has patient been taught Hand Expression?: Yes Does the patient have breastfeeding experience prior to this  delivery?: No  Feeding Feeding Type: Bottle Fed - Breast Milk Nipple Type: Slow - flow  Interventions Interventions: DEBP;Breast feeding basics reviewed  Lactation Tools Discussed/Used Nipple shield size: 24 Breast pump type: Double-Electric Breast Pump WIC Program: No Pump Review: Setup, frequency, and cleaning;Milk Storage   Consult Status Consult Status: Follow-up Date: 12/03/18 Follow-up type: Call as needed    Walker Shadow 12/02/2018, 10:58 AM

## 2018-12-03 ENCOUNTER — Telehealth: Payer: Self-pay | Admitting: Lactation Services

## 2018-12-03 ENCOUNTER — Ambulatory Visit: Payer: Self-pay

## 2018-12-03 MED ORDER — IBUPROFEN 600 MG PO TABS: 600.0000 mg | ORAL_TABLET | Freq: Four times a day (QID) | ORAL | 0 refills | Status: DC

## 2018-12-03 NOTE — Progress Notes (Signed)
Late documentation Post Partum Day 1 Subjective: no complaints, up ad lib, voiding and tolerating PO  Objective: Blood pressure 123/83, pulse 87, temperature 97.8 F (36.6 C), temperature source Oral, resp. rate 18, height 5\' 1"  (1.549 m), weight 75.3 kg, last menstrual period 03/03/2018, SpO2 97 %, unknown if currently breastfeeding.  Physical Exam:  General: alert, cooperative and no distress Lochia: appropriate Uterine Fundus: firm Incision: n/a DVT Evaluation: No evidence of DVT seen on physical exam. Negative Homan's sign. No cords or calf tenderness.  Recent Labs    12/01/18 1138 12/02/18 0534  HGB 13.5 10.5*  HCT 41.0 32.3*    Assessment/Plan: Plan for discharge tomorrow   LOS: 4 days   Levie Heritage 12/03/2018, 8:02 AM

## 2018-12-03 NOTE — Lactation Note (Signed)
This note was copied from a baby's chart. Lactation Consultation Note:   Father returned to room and nurse paged for assistance.  Infant placed on the rt breast in football hold . Infant took 20 ml  Formula at breast with #5 fr feeding tube.  Father very supportive at the bedside.   Infant continued to breastfeed for 20 mins.   Advised mother again to post pump.  Discussed supply and demand.   Patient Name: Girl Alto Denverngela Markie BJSEG'BToday's Date: 12/03/2018 Reason for consult: Follow-up assessment   Maternal Data    Feeding Feeding Type: Breast Fed  LATCH Score Latch: Grasps breast easily, tongue down, lips flanged, rhythmical sucking.  Audible Swallowing: Spontaneous and intermittent  Type of Nipple: Everted at rest and after stimulation  Comfort (Breast/Nipple): Soft / non-tender  Hold (Positioning): Assistance needed to correctly position infant at breast and maintain latch.  LATCH Score: 9  Interventions Interventions: Breast feeding basics reviewed;Assisted with latch;Skin to skin;Breast massage;Hand express;Breast compression;Adjust position;Support pillows;Position options;Expressed milk;Shells;Hand pump;DEBP  Lactation Tools Discussed/Used     Consult Status Consult Status: Follow-up Date: (mother to fup with LC services) Follow-up type: Out-patient    Stevan BornKendrick, [facility] 12/03/2018, 12:23 PM

## 2018-12-03 NOTE — Telephone Encounter (Signed)
Attempted to call [facility] to get scheduled with lactation. No answer, LVM to give office a call back if still interested in services.

## 2018-12-03 NOTE — Discharge Instructions (Signed)
Vaginal Delivery, Care After °Refer to this sheet in the next few weeks. These instructions provide you with information about caring for yourself after vaginal delivery. Your health care provider may also give you more specific instructions. Your treatment has been planned according to current medical practices, but problems sometimes occur. Call your health care provider if you have any problems or questions. °What can I expect after the procedure? °After vaginal delivery, it is common to have: °· Some bleeding from your vagina. °· Soreness in your abdomen, your vagina, and the area of skin between your vaginal opening and your anus (perineum). °· Pelvic cramps. °· Fatigue. °Follow these instructions at home: °Medicines °· Take over-the-counter and prescription medicines only as told by your health care provider. °· If you were prescribed an antibiotic medicine, take it as told by your health care provider. Do not stop taking the antibiotic until it is finished. °Driving ° °· Do not drive or operate heavy machinery while taking prescription pain medicine. °· Do not drive for 24 hours if you received a sedative. °Lifestyle °· Do not drink alcohol. This is especially important if you are breastfeeding or taking medicine to relieve pain. °· Do not use tobacco products, including cigarettes, chewing tobacco, or e-cigarettes. If you need help quitting, ask your health care provider. °Eating and drinking °· Drink at least 8 eight-ounce glasses of water every day unless you are told not to by your health care provider. If you choose to breastfeed your baby, you may need to drink more water than this. °· Eat high-fiber foods every day. These foods may help prevent or relieve constipation. High-fiber foods include: °? Whole grain cereals and breads. °? Brown rice. °? Beans. °? Fresh fruits and vegetables. °Activity °· Return to your normal activities as told by your health care provider. Ask your health care provider what  activities are safe for you. °· Rest as much as possible. Try to rest or take a nap when your baby is sleeping. °· Do not lift anything that is heavier than your baby or 10 lb (4.5 kg) until your health care provider says that it is safe. °· Talk with your health care provider about when you can engage in sexual activity. This may depend on your: °? Risk of infection. °? Rate of healing. °? Comfort and desire to engage in sexual activity. °Vaginal Care °· If you have an episiotomy or a vaginal tear, check the area every day for signs of infection. Check for: °? More redness, swelling, or pain. °? More fluid or blood. °? Warmth. °? Pus or a bad smell. °· Do not use tampons or douches until your health care provider says this is safe. °· Watch for any blood clots that may pass from your vagina. These may look like clumps of dark red, brown, or black discharge. °General instructions °· Keep your perineum clean and dry as told by your health care provider. °· Wear loose, comfortable clothing. °· Wipe from front to back when you use the toilet. °· Ask your health care provider if you can shower or take a bath. If you had an episiotomy or a perineal tear during labor and delivery, your health care provider may tell you not to take baths for a certain length of time. °· Wear a bra that supports your breasts and fits you well. °· If possible, have someone help you with household activities and help care for your baby for at least a few days after you   leave the hospital. °· Keep all follow-up visits for you and your baby as told by your health care provider. This is important. °Contact a health care provider if: °· You have: °? Vaginal discharge that has a bad smell. °? Difficulty urinating. °? Pain when urinating. °? A sudden increase or decrease in the frequency of your bowel movements. °? More redness, swelling, or pain around your episiotomy or vaginal tear. °? More fluid or blood coming from your episiotomy or vaginal  tear. °? Pus or a bad smell coming from your episiotomy or vaginal tear. °? A fever. °? A rash. °? Little or no interest in activities you used to enjoy. °? Questions about caring for yourself or your baby. °· Your episiotomy or vaginal tear feels warm to the touch. °· Your episiotomy or vaginal tear is separating or does not appear to be healing. °· Your breasts are painful, hard, or turn red. °· You feel unusually sad or worried. °· You feel nauseous or you vomit. °· You pass large blood clots from your vagina. If you pass a blood clot from your vagina, save it to show to your health care provider. Do not flush blood clots down the toilet without having your health care provider look at them. °· You urinate more than usual. °· You are dizzy or light-headed. °· You have not breastfed at all and you have not had a menstrual period for 12 weeks after delivery. °· You have stopped breastfeeding and you have not had a menstrual period for 12 weeks after you stopped breastfeeding. °Get help right away if: °· You have: °? Pain that does not go away or does not get better with medicine. °? Chest pain. °? Difficulty breathing. °? Blurred vision or spots in your vision. °? Thoughts about hurting yourself or your baby. °· You develop pain in your abdomen or in one of your legs. °· You develop a severe headache. °· You faint. °· You bleed from your vagina so much that you fill two sanitary pads in one hour. °This information is not intended to replace advice given to you by your health care provider. Make sure you discuss any questions you have with your health care provider. °Document Released: 10/19/2000 Document Revised: 04/04/2016 Document Reviewed: 11/06/2015 °Elsevier Interactive Patient Education © 2019 [employer] ° °

## 2018-12-03 NOTE — Lactation Note (Signed)
This note was copied from a baby's chart. Lactation Consultation Note: Mother reports that she has attempt to latch infant several times but unable to latch.  Mother has a #24 NS and has attempt to use with formula in shield.  After multiple questions taking history , mother confirms that infant has not ever latched to breast.   Mother reports that she wants to breast feeding.   Infant cuing in crib. Mother changed infant wet diaper.  Infant then placed STS in football hold.  Infant latched on with #5 fr feeding tube and took 10 ml of formula.  Infant remained at the breast for 25 mins. Observed suckling and swallows.  Mother taught to do good breast compression.   Father of infant out of the room. Advised to page for [facility] to return to  Educate father on assisting with infant feeding.  Encouraged mother to continue to do frequent STS .  Mother was given a hand pump with instructions to pump every 2-3 hours for 15 mins on each breast.  Mother reports that she has a [facility] appt on Monday, she plans to get a pump then.  [facility] referral sent yesterday.   WIC in the hospital and was informed of mothers need for a pump.  Discussed WIC loaner with mother. Mother to page for paperwork if she wants to rent.  Advised mother to continue to breastfeed infant on cue and 8-12 times or more in 24 hours.  Mother to post pump for 15 mins on each breast or 15-20 if using DEBP. Mother to continue to supplement according to guidelines. Infant is being fed Neosure.   Discussed treatment and prevention of engorgement. . Encouraged mother to follow up with [facility] services for pre and post weight assessment.  Mother is aware of available LC services and 24 hr phone line for breastfeeding questions or concerns.     Patient Name: Natalie Holmes Date: 12/03/2018 Reason for consult: Infant < 6lbs;Follow-up assessment;Difficult latch   Maternal Data    Feeding Feeding Type: Breast Fed  LATCH  Score Latch: Grasps breast easily, tongue down, lips flanged, rhythmical sucking.  Audible Swallowing: Spontaneous and intermittent  Type of Nipple: Everted at rest and after stimulation  Comfort (Breast/Nipple): Soft / non-tender  Hold (Positioning): Assistance needed to correctly position infant at breast and maintain latch.  LATCH Score: 9  Interventions Interventions: Breast feeding basics reviewed;Assisted with latch;Skin to skin;Breast massage;Hand express;Breast compression;Adjust position;Support pillows;Position options;Expressed milk;Shells;Hand pump;DEBP  Lactation Tools Discussed/Used     Consult Status Consult Status: Follow-up Date: (mother to fup with LC services) Follow-up type: Out-patient    Stevan Born [facility] 12/03/2018, 10:48 AM

## 2018-12-07 ENCOUNTER — Other Ambulatory Visit: Payer: Self-pay

## 2018-12-07 ENCOUNTER — Encounter (HOSPITAL_COMMUNITY): Payer: Self-pay

## 2018-12-07 ENCOUNTER — Inpatient Hospital Stay (HOSPITAL_COMMUNITY)
Admission: AD | Admit: 2018-12-07 | Discharge: 2018-12-07 | Disposition: A | Discharge status: Home / Self Care | Payer: Medicaid Other | Source: Ambulatory Visit | Attending: Obstetrics & Gynecology | Admitting: Obstetrics & Gynecology

## 2018-12-07 DIAGNOSIS — R030 Elevated blood-pressure reading, without diagnosis of hypertension: Secondary | ICD-10-CM | POA: Insufficient documentation

## 2018-12-07 DIAGNOSIS — N939 Abnormal uterine and vaginal bleeding, unspecified: Secondary | ICD-10-CM

## 2018-12-07 DIAGNOSIS — O9089 Other complications of the puerperium, not elsewhere classified: Secondary | ICD-10-CM | POA: Diagnosis not present

## 2018-12-07 HISTORY — DX: Gestational (pregnancy-induced) hypertension without significant proteinuria, unspecified trimester: O13.9

## 2018-12-07 LAB — CBC
HCT: 38.2 % (ref 36.0–46.0)
Hemoglobin: 12.7 g/dL (ref 12.0–15.0)
MCH: 29.1 pg (ref 26.0–34.0)
MCHC: 33.2 g/dL (ref 30.0–36.0)
MCV: 87.4 fL (ref 80.0–100.0)
Platelets: 292 10*3/uL (ref 150–400)
RBC: 4.37 MIL/uL (ref 3.87–5.11)
RDW: 15.2 % (ref 11.5–15.5)
WBC: 13.7 10*3/uL — AB (ref 4.0–10.5)
nRBC: 0 % (ref 0.0–0.2)

## 2018-12-07 LAB — COMPREHENSIVE METABOLIC PANEL
ALT: 35 U/L (ref 0–44)
AST: 26 U/L (ref 15–41)
Albumin: 2.9 g/dL — ABNORMAL LOW (ref 3.5–5.0)
Alkaline Phosphatase: 98 U/L (ref 38–126)
Anion gap: 9 (ref 5–15)
BUN: 9 mg/dL (ref 6–20)
CO2: 22 mmol/L (ref 22–32)
Calcium: 8.6 mg/dL — ABNORMAL LOW (ref 8.9–10.3)
Chloride: 110 mmol/L (ref 98–111)
Creatinine, Ser: 0.57 mg/dL (ref 0.44–1.00)
GFR calc Af Amer: >60 mL/min (ref >60)
GFR calc non Af Amer: >60 mL/min (ref >60)
Glucose, Bld: 90 mg/dL (ref 70–99)
Potassium: 3.8 mmol/L (ref 3.5–5.1)
Sodium: 141 mmol/L (ref 135–145)
Total Bilirubin: 0.6 mg/dL (ref 0.3–1.2)
Total Protein: 6.5 g/dL (ref 6.5–8.1)

## 2018-12-07 MED ORDER — AMLODIPINE BESYLATE 5 MG PO TABS: 5.0000 mg | ORAL_TABLET | Freq: Every day | ORAL | 0 refills | Status: DC

## 2018-12-07 NOTE — MAU Note (Signed)
Pt reports to MAU post vaginal delivery on 12/01/18 with vaginal bleeding. Pt reports she bleeding heavily pt reports she has soaked through a pad in one hour no clots. Pt denies dizziness or lightlessness. Pt is BF. Pt denies HA. Pt reports no appetite.

## 2018-12-07 NOTE — MAU Provider Note (Signed)
History     CSN: [id number]  Arrival date and time: 12/07/18 2154   First Provider Initiated Contact with Patient 12/07/18 2226      Chief Complaint  Patient presents with  . Vaginal Bleeding   HPI Natalie Holmes is a 23 y.o. G1P1001 postpartum from a vaginal delivery on 1/27 who presents with vaginal bleeding. She states she was pumping and felt a gush and bled through her pad 2 times. She reports a small amount of cramping, mostly when she pumps. She states the bleeding has slowed down since arrival to MAU. She denies any fevers at home. She was induced for gestational hypertension and denies any headache, visual changes or epigastric pain.  OB History    Gravida  1   Para  1   Term  1   Preterm      AB      Living  1     SAB      TAB      Ectopic      Multiple      Live Births  1           Past Medical History:  Diagnosis Date  . Pregnancy induced hypertension     History reviewed. No pertinent surgical history.  Family History  Problem Relation Age of Onset  . Healthy Mother   . Healthy Father     Social History   Tobacco Use  . Smoking status: Never Smoker  . Smokeless tobacco: Never Used  Substance Use Topics  . Alcohol use: Not Currently    Alcohol/week: 1.0 standard drinks    Types: 1 Glasses of wine per week    Frequency: Never  . Drug use: Never    Allergies:  Allergies  Allergen Reactions  . Penicillins Hives    Did it involve swelling of the face/tongue/throat, SOB, or low BP? Unknown Did it involve sudden or severe rash/hives, skin peeling, or any reaction on the inside of your mouth or nose? No Did you need to seek medical attention at a hospital or doctor's office? No When did it last happen?2 years ago If all above answers are "NO", may proceed with cephalosporin use.     Medications Prior to Admission  Medication Sig Dispense Refill Last Dose  . ibuprofen (ADVIL,MOTRIN) 600 MG tablet Take 1 tablet (600 mg  total) by mouth every 6 (six) hours. 30 tablet 0 12/07/2018 at Unknown time  . Prenat w/o A Vit-FeFum-FePo-FA (CONCEPT OB) 130-92.4-1 MG CAPS Take 1 capsule by mouth daily. 90 capsule 3 12/07/2018 at Unknown time  . ferrous sulfate 325 (65 FE) MG tablet Take 1 tablet (325 mg total) by mouth daily. ferrous sulfate 325 mg (65 mg iron) tablet  Take 1 tablet twice a day by oral route for 30 days. 90 tablet 1 Unknown at Unknown time    Review of Systems  Constitutional: Negative.  Negative for fatigue and fever.  HENT: Negative.   Respiratory: Negative.  Negative for shortness of breath.   Cardiovascular: Negative.  Negative for chest pain.  Gastrointestinal: Positive for abdominal pain. Negative for constipation, diarrhea, nausea and vomiting.  Genitourinary: Positive for vaginal bleeding. Negative for dysuria.  Neurological: Negative.  Negative for dizziness and headaches.   Physical Exam   Blood pressure (!) 135/96, pulse 80, temperature 98.3 F (36.8 C), temperature source Oral, resp. rate 18, height 5\' 1"  (1.549 m), weight 68.5 kg, unknown if currently breastfeeding.  Patient Vitals for the past 24 hrs:  BP Temp Temp src Pulse Resp Height Weight  12/07/18 2331 (!) 133/97 - - 85 - - -  12/07/18 2316 133/90 - - 78 - - -  12/07/18 2301 127/89 - - 83 - - -  12/07/18 2246 (!) 118/91 - - 77 - - -  12/07/18 2220 126/83 - - 78 - - -  12/07/18 2204 (!) 135/96 98.3 F (36.8 C) Oral 80 18 - -  12/07/18 2202 - - - - - 5\' 1"  (1.549 m) 68.5 kg   Physical Exam  Nursing note and vitals reviewed. Constitutional: She is oriented to person, place, and time. She appears well-developed and well-nourished. No distress.  HENT:  Head: Normocephalic.  Eyes: Pupils are equal, round, and reactive to light.  Cardiovascular: Normal rate, regular rhythm and normal heart sounds.  Respiratory: Effort normal and breath sounds normal. No respiratory distress.  GI: Soft. Bowel sounds are normal. She exhibits no  distension. There is no abdominal tenderness.  Genitourinary:    Genitourinary Comments: SSE: small amount of pink mucous discharge. No uterine or cervical tenderness   Neurological: She is alert and oriented to person, place, and time.  Skin: Skin is warm and dry.  Psychiatric: She has a normal mood and affect. Her behavior is normal. Judgment and thought content normal.    MAU Course  Procedures Results for orders placed or performed during the hospital encounter of 12/07/18 (from the past 24 hour(s))  CBC     Status: Abnormal   Collection Time: 12/07/18 10:56 PM  Result Value Ref Range   WBC 13.7 (H) 4.0 - 10.5 K/uL   RBC 4.37 3.87 - 5.11 MIL/uL   Hemoglobin 12.7 12.0 - 15.0 g/dL   HCT 04.5 99.7 - 74.1 %   MCV 87.4 80.0 - 100.0 fL   MCH 29.1 26.0 - 34.0 pg   MCHC 33.2 30.0 - 36.0 g/dL   RDW 42.3 95.3 - 20.2 %   Platelets 292 150 - 400 K/uL   nRBC 0.0 0.0 - 0.2 %  Comprehensive metabolic panel     Status: Abnormal   Collection Time: 12/07/18 10:56 PM  Result Value Ref Range   Sodium 141 135 - 145 mmol/L   Potassium 3.8 3.5 - 5.1 mmol/L   Chloride 110 98 - 111 mmol/L   CO2 22 22 - 32 mmol/L   Glucose, Bld 90 70 - 99 mg/dL   BUN 9 6 - 20 mg/dL   Creatinine, Ser 3.34 0.44 - 1.00 mg/dL   Calcium 8.6 (L) 8.9 - 10.3 mg/dL   Total Protein 6.5 6.5 - 8.1 g/dL   Albumin 2.9 (L) 3.5 - 5.0 g/dL   AST 26 15 - 41 U/L   ALT 35 0 - 44 U/L   Alkaline Phosphatase 98 38 - 126 U/L   Total Bilirubin 0.6 0.3 - 1.2 mg/dL   GFR calc non Af Amer >60 >60 mL/min   GFR calc Af Amer >60 >60 mL/min   Anion gap 9 5 - 15   MDM CBC, CMP  Patient with consistently elevated BP but normal labs and asymptomatic. Will start on BP meds and bring patient back for BP check by Friday.  Assessment and Plan   1. Postpartum state   2. Vaginal bleeding   3. Transient elevated blood pressure    -Discharge home in stable condition -Rx for norvasc sent to patient's pharmacy -Preeclampsia and bleeding  precautions discussed -Patient advised to follow-up with [facility] on Friday for a BP  check, message sent, will call patient with appointment -Patient may return to MAU as needed or if her condition were to change or worsen  Rolm BookbinderCaroline M Amond Speranza CNM 12/07/2018, 10:26 PM

## 2018-12-12 ENCOUNTER — Ambulatory Visit (INDEPENDENT_AMBULATORY_CARE_PROVIDER_SITE_OTHER): Payer: Medicaid Other | Admitting: General Practice

## 2018-12-12 VITALS — BP 110/83 | HR 68 | Ht 61.0 in | Wt 144.0 lb

## 2018-12-12 DIAGNOSIS — Z0130 Encounter for examination of blood pressure without abnormal findings: Secondary | ICD-10-CM

## 2018-12-12 NOTE — Progress Notes (Signed)
Patient presents to office today for BP check following SVD 1/27. Patient had gestational HTN towards end of pregnancy but was not on medication. Patient had MAU visit on 2/2 for bleeding and was found to have elevated BP and was started on Norvasc 5mg . Patient denies headaches, dizziness or blurry vision and has been taking BP med daily. BP is WNL today. Reviewed with Dr Marice Potter who states patient can continue medication and follow up at pp visit on 2/27. Patient informed.  Chase Caller RN BSN 12/12/18

## 2018-12-15 NOTE — Progress Notes (Signed)
I have reviewed this chart and agree with the RN/CMA assessment and management.    Tynell Winchell C Jaylanie Boschee, MD, FACOG Attending Physician, [facility]  

## 2019-01-01 ENCOUNTER — Encounter: Payer: Self-pay | Admitting: Obstetrics and Gynecology

## 2019-01-01 ENCOUNTER — Ambulatory Visit (INDEPENDENT_AMBULATORY_CARE_PROVIDER_SITE_OTHER): Payer: Medicaid Other | Admitting: Obstetrics and Gynecology

## 2019-01-01 VITALS — BP 113/75 | HR 83 | Wt 138.0 lb

## 2019-01-01 DIAGNOSIS — Z3040 Encounter for surveillance of contraceptives, unspecified: Secondary | ICD-10-CM

## 2019-01-01 DIAGNOSIS — Z1389 Encounter for screening for other disorder: Secondary | ICD-10-CM

## 2019-01-01 DIAGNOSIS — Z975 Presence of (intrauterine) contraceptive device: Secondary | ICD-10-CM | POA: Insufficient documentation

## 2019-01-01 DIAGNOSIS — Z30017 Encounter for initial prescription of implantable subdermal contraceptive: Secondary | ICD-10-CM

## 2019-01-01 LAB — POCT PREGNANCY, URINE: Preg Test, Ur: NEGATIVE

## 2019-01-01 MED ORDER — ETONOGESTREL 68 MG ~~LOC~~ IMPL
68.0000 mg | DRUG_IMPLANT | Freq: Once | SUBCUTANEOUS | Status: AC
  Administered 2019-01-01: 68 mg via SUBCUTANEOUS

## 2019-01-01 NOTE — Progress Notes (Signed)
Subjective:     Natalie Holmes is a 23 y.o. female who presents for a postpartum visit. She is 5 weeks postpartum following a spontaneous vaginal delivery. I have fully reviewed the prenatal and intrapartum course. The delivery was at 39 gestational weeks. Outcome: spontaneous vaginal delivery. Anesthesia: epidural. Postpartum course has been unremarkable. Baby's course has been unremarkable. Baby is feeding by breast. Bleeding no bleeding. Bowel function is abnormal: every few days . Bladder function is normal. Patient is not sexually active. Contraception method is Nexplanon. Postpartum depression screening: negative.  The following portions of the patient's history were reviewed and updated as appropriate: allergies, current medications, past family history, past medical history, past social history, past surgical history and problem list.  Review of Systems Pertinent items are noted in HPI.   Objective:    BP 113/75   Pulse 83   Wt 138 lb (62.6 kg)   BMI 26.07 kg/m   General:  alert  Lungs: clear to auscultation bilaterally  Heart:  regular rate and rhythm, S1, S2 normal, no murmur, click, rub or gallop  Abdomen: soft, non-tender; bowel sounds normal; no masses,  no organomegaly   Assessment:   Normal postpartum exam. Pap smear not done at today's visit.  Normal 05/05/2018  Plan:   1. Contraception: Nexplanon 2. Initial BP elevated, repeat normal. Continue Norvasc 5 mg PO  3. Follow up in: as needed.     Duane Lope, NP 01/01/2019 3:39 PM

## 2019-01-28 ENCOUNTER — Encounter: Payer: Self-pay | Admitting: *Deleted

## 2019-04-27 ENCOUNTER — Telehealth: Payer: Self-pay | Admitting: Advanced Practice Midwife

## 2019-04-27 NOTE — Telephone Encounter (Signed)
The patient called in stating she would like to schedule an appointment to speak with someone about the birth control. She stated she was told there are some options she can try to assist with issues she is experiencing. Informed the patient of a birth control consult. However informed if she would like to remove we would scheduled for an in office visit.

## 2019-05-07 ENCOUNTER — Telehealth (INDEPENDENT_AMBULATORY_CARE_PROVIDER_SITE_OTHER): Payer: Medicaid Other | Admitting: Obstetrics and Gynecology

## 2019-05-07 ENCOUNTER — Other Ambulatory Visit: Payer: Self-pay

## 2019-05-07 ENCOUNTER — Telehealth: Payer: Self-pay | Admitting: General Practice

## 2019-05-07 DIAGNOSIS — Z975 Presence of (intrauterine) contraceptive device: Secondary | ICD-10-CM | POA: Insufficient documentation

## 2019-05-07 DIAGNOSIS — Z30011 Encounter for initial prescription of contraceptive pills: Secondary | ICD-10-CM

## 2019-05-07 DIAGNOSIS — F32A Depression, unspecified: Secondary | ICD-10-CM

## 2019-05-07 DIAGNOSIS — F329 Major depressive disorder, single episode, unspecified: Secondary | ICD-10-CM

## 2019-05-07 DIAGNOSIS — F418 Other specified anxiety disorders: Secondary | ICD-10-CM | POA: Diagnosis not present

## 2019-05-07 DIAGNOSIS — N921 Excessive and frequent menstruation with irregular cycle: Secondary | ICD-10-CM | POA: Diagnosis not present

## 2019-05-07 DIAGNOSIS — O99345 Other mental disorders complicating the puerperium: Secondary | ICD-10-CM

## 2019-05-07 MED ORDER — NORGESTIMATE-ETH ESTRADIOL 0.25-35 MG-MCG PO TABS: 1.0000 | ORAL_TABLET | Freq: Every day | ORAL | 0 refills | Status: DC

## 2019-05-07 MED ORDER — SERTRALINE HCL 50 MG PO TABS: ORAL_TABLET | ORAL | 1 refills | Status: DC

## 2019-05-07 NOTE — Telephone Encounter (Signed)
Patient called and left message stating she had an appt today and was prescribed two medications, one for birth control and one for zoloft. Patient states only the OCP Rx is at her pharmacy not the zoloft.  Called [facility] who provided verbal order for zoloft 50mg  tablets to take daily, patient may increase to 75mg  after 1 week if needed disp #90 1 refill & sprintec x 1 pack. Prescriptions ordered. Called & informed patient. Patient verbalized understanding & had no questions.

## 2019-05-07 NOTE — Progress Notes (Signed)
   TELEHEALTH VIRTUAL GYNECOLOGY VISIT ENCOUNTER NOTE  I connected with Natalie Holmes on 05/07/19 at 11:15 AM EDT by telephone at home and verified that I am speaking with the correct person using two identifiers.   I discussed the limitations, risks, security and privacy concerns of performing an evaluation and management service by telephone and the availability of in person appointments. I also discussed with the patient that there may be a patient responsible charge related to this service. The patient expressed understanding and agreed to proceed.   History:  Natalie Holmes is a 23 y.o. G47P1001 female being evaluated today for abnormal vaginal bleeding/spotting and postpartum anxiety/ depression/worry. She had her baby 5 months ago and had a nexplanon placed. Her first period was normal and subsequent periods have been abnormal. She has bleeding in between her periods almost daily. She denies pains.  Since her baby was born she has been worried an anxious. This is new for her. This is the first time she has voiced these symptoms. She is not currently in counseling. She is breast feeding. She has no thoughts of harm to her self or others. She does find joy in her life.   She denies any abnormal vaginal discharge, pelvic pain or other concerns.  She would like to keep nexplanon in place.      Past Medical History:  Diagnosis Date  . Pregnancy induced hypertension    No past surgical history on file. The following portions of the patient's history were reviewed and updated as appropriate: allergies, current medications, past family history, past medical history, past social history, past surgical history and problem list.   Review of Systems:  Pertinent items noted in HPI and remainder of comprehensive ROS otherwise negative.  Physical Exam:   General:  Alert, oriented and cooperative.   Mental Status: Normal mood and affect perceived. Normal judgment and thought content.  Physical exam  deferred due to nature of the encounter  Labs and Imaging No results found for this or any previous visit (from the past 336 hour(s)). No results found.    Assessment and Plan:   1. Postpartum anxiety  Rx: Zoloft Get outside daily Start counseling. Information given on local counseling facility.   2. Breakthrough bleeding on Nexplanon  Rx: Sprintec X 1 month. Will schedule her for f/u visit in 1 month. Bleeding precautions.     I discussed the assessment and treatment plan with the patient. The patient was provided an opportunity to ask questions and all were answered. The patient agreed with the plan and demonstrated an understanding of the instructions.   The patient was advised to call back or seek an in-person evaluation/go to the ED if the symptoms worsen or if the condition fails to improve as anticipated.  I provided 13 minutes of non-face-to-face time during this encounter.   Noni Saupe, NP [facility], [place]

## 2019-05-07 NOTE — Patient Instructions (Signed)
[facility] : [phone]

## 2019-05-07 NOTE — Progress Notes (Signed)
I connected with  Natalie Holmes on 05/07/19 at 11:15 AM EDT by telephone Via Mychart Video and verified that I am speaking with the correct person using two identifiers.   I discussed the limitations, risks, security and privacy concerns of performing an evaluation and management service by telephone and the availability of in person appointments. I also discussed with the patient that there may be a patient responsible charge related to this service. The patient expressed understanding and agreed to proceed.  [place], [place] 05/07/2019  11:05 AM

## 2019-06-04 ENCOUNTER — Other Ambulatory Visit: Payer: Self-pay

## 2019-06-04 ENCOUNTER — Telehealth (INDEPENDENT_AMBULATORY_CARE_PROVIDER_SITE_OTHER): Payer: Medicaid Other | Admitting: Obstetrics and Gynecology

## 2019-06-04 ENCOUNTER — Encounter: Payer: Self-pay | Admitting: Obstetrics and Gynecology

## 2019-06-04 DIAGNOSIS — Z09 Encounter for follow-up examination after completed treatment for conditions other than malignant neoplasm: Secondary | ICD-10-CM

## 2019-06-04 DIAGNOSIS — Z975 Presence of (intrauterine) contraceptive device: Secondary | ICD-10-CM | POA: Diagnosis not present

## 2019-06-04 NOTE — Progress Notes (Signed)
   TELEHEALTH VIRTUAL GYNECOLOGY VISIT ENCOUNTER NOTE  I connected with Natalie Holmes on 06/04/19 at  9:15 AM EDT by telephone at home and verified that I am speaking with the correct person using two identifiers.   I discussed the limitations, risks, security and privacy concerns of performing an evaluation and management service by telephone and the availability of in person appointments. I also discussed with the patient that there may be a patient responsible charge related to this service. The patient expressed understanding and agreed to proceed.   History:  Natalie Holmes is a 23 y.o. G65P1001 female being evaluated today for F/u. She had a nexplanon recently placed postpartum and had abnormal vaginal bleeding since the nexplanon was placed. She was started on sprintec X 1 pack and states it has helped tremendously. She also had some concerns with anxiety/depression. She was started on Zoloft. She took that for about a week and stopped because she felt it made her symptoms worse. Since starting the [facility] pills she feels much better. Her mood is better. She is sleeping more at night because baby is on a better schedule. Over all she feels really good.  She denies any abnormal vaginal discharge, bleeding, pelvic pain or other concerns.       Past Medical History:  Diagnosis Date  . Pregnancy induced hypertension    No past surgical history on file. The following portions of the patient's history were reviewed and updated as appropriate: allergies, current medications, past family history, past medical history, past social history, past surgical history and problem list.   Review of Systems:  Pertinent items noted in HPI and remainder of comprehensive ROS otherwise negative.  Physical Exam:   General:  Alert, oriented and cooperative.   Mental Status: Normal mood and affect perceived. Normal judgment and thought content.  Physical exam deferred due to nature of the encounter  Labs and  Imaging No results found for this or any previous visit (from the past 336 hour(s)). No results found.    Assessment and Plan:      1. Follow up  Doing very well.  If anxiety/depression arise call the office to schedule a visit.   2. Nexplanon in place  Break through bleeding has stopped. Continue BC pills until completed.   I discussed the assessment and treatment plan with the patient. The patient was provided an opportunity to ask questions and all were answered. The patient agreed with the plan and demonstrated an understanding of the instructions.   The patient was advised to call back or seek an in-person evaluation/go to the ED if the symptoms worsen or if the condition fails to improve as anticipated.  I provided 7 minutes of non-face-to-face time during this encounter.   Noni Saupe, NP [facility] [employer], [place]

## 2019-08-30 ENCOUNTER — Other Ambulatory Visit: Payer: Self-pay

## 2019-08-30 ENCOUNTER — Ambulatory Visit
Admission: EM | Admit: 2019-08-30 | Discharge: 2019-08-30 | Disposition: A | Discharge status: Home / Self Care | Payer: Medicaid Other | Attending: Emergency Medicine | Admitting: Emergency Medicine

## 2019-08-30 ENCOUNTER — Encounter: Payer: Self-pay | Admitting: Emergency Medicine

## 2019-08-30 DIAGNOSIS — N649 Disorder of breast, unspecified: Secondary | ICD-10-CM

## 2019-08-30 DIAGNOSIS — L309 Dermatitis, unspecified: Secondary | ICD-10-CM | POA: Diagnosis not present

## 2019-08-30 MED ORDER — MUPIROCIN CALCIUM 2 % EX CREA: 1.0000 "application " | TOPICAL_CREAM | Freq: Three times a day (TID) | CUTANEOUS | 0 refills | Status: AC

## 2019-08-30 MED ORDER — PREDNISONE 10 MG (21) PO TBPK: ORAL_TABLET | Freq: Every day | ORAL | 0 refills | Status: DC

## 2019-08-30 NOTE — Discharge Instructions (Addendum)
Use antibiotic ointment 3 times daily. Important to keep skin clean/dry: May pat area dry and avoid hot showers as this can further dry out skin and cause worse itching. Take prednisone taper as prescribed. Very important to follow-up with OB/GYN for further evaluation/management. Return for worsening pain, discharge, nipple inversion, fever, joint pain.

## 2019-08-30 NOTE — ED Triage Notes (Addendum)
Pt presents to [facility] for assessment after stopping breast feeding about 1 month ago with bilateral nipple tenderness and itching.  States about 1 week it started "spreading" to other placed, including her groin, bilateral axilla, and upper chest/neck.  Patient states it may have also started around when she got her nexplanon.

## 2019-08-30 NOTE — ED Provider Notes (Signed)
[name]    CSN: [id number] Arrival date & time: 08/30/19  1012      History   Chief Complaint Chief Complaint  Patient presents with  . Rash    HPI Natalie Holmes is a 23 y.o. female presenting for 1 month course of bilateral nipple tenderness, pruritus with "spreading "over the last week.  Patient states that she now has mild rash with pruritus over her bilateral groin area, bilateral axilla, upper chest (which is least symptomatic).  Patient states that she had discontinued breast-feeding 1 week prior to symptom onset.  Patient denies known bug bites, allergies.  Patient has changed her soap, lotion, though still using a new laundry detergent that is "stronger than normal ".  Patient denies fever, headache, fatigue, arthralgias.  Patient denies personal or family history of gynecologic malignancy.  Patient has tried OTC "anti-itching creams "without significant relief.  Patient denies nipple discharge, inversion, peau d'orange, palpable mass.   Past Medical History:  Diagnosis Date  . Pregnancy induced hypertension     Patient Active Problem List   Diagnosis Date Noted  . Follow up 06/04/2019  . Breakthrough bleeding on Nexplanon 05/07/2019  . Postpartum anxiety 05/07/2019  . Postpartum care and examination 01/01/2019  . Nexplanon in place 01/01/2019  . Gestational hypertension 11/29/2018  . Penicillin allergy 11/11/2018  . Encounter for supervision of normal first pregnancy in third trimester 08/18/2018    History reviewed. No pertinent surgical history.  OB History    Gravida  1   Para  1   Term  1   Preterm      AB      Living  1     SAB      TAB      Ectopic      Multiple      Live Births  1            Home Medications    Prior to Admission medications   Medication Sig Start Date End Date Taking? Authorizing Provider  ibuprofen (ADVIL,MOTRIN) 600 MG tablet Take 1 tablet (600 mg total) by mouth every 6 (six) hours.  Patient not taking: Reported on 01/01/2019 12/03/18   Levie HeritageStinson, Jacob J, DO  mupirocin cream (BACTROBAN) 2 % Apply 1 application topically 3 (three) times daily for 7 days. 08/30/19 09/06/19  Hall-Potvin, [place], PA-C  predniSONE (STERAPRED UNI-PAK 21 TAB) 10 MG (21) TBPK tablet Take by mouth daily. Take steroid taper as written 08/30/19   Hall-Potvin, [place], PA-C  amLODipine (NORVASC) 5 MG tablet Take 1 tablet (5 mg total) by mouth daily. Patient not taking: Reported on 06/04/2019 12/07/18 08/30/19  Rolm BookbinderNeill, Caroline M, CNM  ferrous sulfate 325 (65 FE) MG tablet Take 1 tablet (325 mg total) by mouth daily. ferrous sulfate 325 mg (65 mg iron) tablet  Take 1 tablet twice a day by oral route for 30 days. Patient not taking: Reported on 01/01/2019 09/15/18 08/30/19  Gwenevere AbbotPhillip, Nimeka, MD  norgestimate-ethinyl estradiol (ORTHO-CYCLEN) 0.25-35 MG-MCG tablet Take 1 tablet by mouth daily. 05/07/19 08/30/19  Rasch, Harolyn RutherfordJennifer I, NP    Family History Family History  Problem Relation Age of Onset  . Healthy Mother   . Healthy Father     Social History Social History   Tobacco Use  . Smoking status: Never Smoker  . Smokeless tobacco: Never Used  Substance Use Topics  . Alcohol use: Not Currently    Alcohol/week: 1.0 standard drinks    Types: 1 Glasses of wine  per week    Frequency: Never  . Drug use: Never     Allergies   Penicillins   Review of Systems Review of Systems  Constitutional: Negative for fatigue and fever.  HENT: Negative for ear pain, sinus pain, sore throat and voice change.   Eyes: Negative for pain, redness and visual disturbance.  Respiratory: Negative for cough and shortness of breath.   Cardiovascular: Negative for chest pain and palpitations.  Gastrointestinal: Negative for abdominal pain, diarrhea and vomiting.  Musculoskeletal: Negative for arthralgias and myalgias.  Skin: Positive for rash. Negative for wound.  Neurological: Negative for syncope and headaches.      Physical Exam Triage Vital Signs ED Triage Vitals  Enc Vitals Group     BP      Pulse      Resp      Temp      Temp src      SpO2      Weight      Height      Head Circumference      Peak Flow      Pain Score      Pain Loc      Pain Edu?      Excl. in GC?    No data found.  Updated Vital Signs BP 117/87 (BP Location: Left Arm)   Pulse 91   Temp 99 F (37.2 C) (Oral)   Resp 18   SpO2 98%   Visual Acuity Right Eye Distance:   Left Eye Distance:   Bilateral Distance:    Right Eye Near:   Left Eye Near:    Bilateral Near:     Physical Exam Constitutional:      General: She is not in acute distress. HENT:     Head: Normocephalic and atraumatic.  Eyes:     General: No scleral icterus.    Pupils: Pupils are equal, round, and reactive to light.  Cardiovascular:     Rate and Rhythm: Normal rate.  Pulmonary:     Effort: Pulmonary effort is normal.  Chest:     Chest wall: No mass, lacerations, swelling, tenderness or crepitus.     Breasts: Tanner Score is 5.        Right: Skin change present. No inverted nipple, mass, nipple discharge or tenderness.        Left: No inverted nipple, mass, nipple discharge, skin change or tenderness.       Comments: 2 x 3 cm oval shaped area of hyperpigmentation, scant superficial skin breakdown.  No induration, fluctuance, warmth or tenderness.  No active discharge, bleeding.  No anterior mass palpated. Lymphadenopathy:     Upper Body:     Right upper body: No supraclavicular or axillary adenopathy.     Left upper body: No supraclavicular or axillary adenopathy.  Skin:    Comments: Scant, diffuse macular rash on axilla bilaterally, groin bilaterally without underlying erythema, warmth, tenderness, open wound, discharge.  Neurological:     Mental Status: She is alert and oriented to person, place, and time.      UC Treatments / Results  Labs (all labs ordered are listed, but only abnormal results are displayed) Labs  Reviewed - No data to display  EKG   Radiology No results found.  Procedures Procedures (including critical care time)  Medications Ordered in UC Medications - No data to display  Initial Impression / Assessment and Plan / UC Course  I have reviewed the triage vital signs and  the nursing notes.  Pertinent labs & imaging results that were available during my care of the patient were reviewed by me and considered in my medical decision making (see chart for details).     1.  Breast lesion We will start mupirocin cream given increased risk for secondary infection due to open cracking and skin.  Patient no longer breast-feeding.  Will avoid hot showers, pat area dry.  Patient also to follow-up with OB/GYN for further evaluation/management despite risk for malignancy being low.  2.  Dermatitis Patient to start prednisone taper today (not breast-feeding) for pruritic, multiregional rash.  Also advised that patient discontinue detergent, switch to 1 without dye/perfume.  Return precautions discussed, patient verbalized understanding and is agreeable to plan. Final Clinical Impressions(s) / UC Diagnoses   Final diagnoses:  Breast lesion  Dermatitis     Discharge Instructions     Use antibiotic ointment 3 times daily. Important to keep skin clean/dry: May pat area dry and avoid hot showers as this can further dry out skin and cause worse itching. Take prednisone taper as prescribed. Very important to follow-up with OB/GYN for further evaluation/management. Return for worsening pain, discharge, nipple inversion, fever, joint pain.    ED Prescriptions    Medication Sig Dispense Auth. Provider   predniSONE (STERAPRED UNI-PAK 21 TAB) 10 MG (21) TBPK tablet Take by mouth daily. Take steroid taper as written 21 tablet Hall-Potvin, [place], PA-C   mupirocin cream (BACTROBAN) 2 % Apply 1 application topically 3 (three) times daily for 7 days. 15 g Hall-Potvin, [place], PA-C      PDMP not reviewed this encounter.   Hall-Potvin, [place], [place] 08/30/19 1729

## 2019-09-09 ENCOUNTER — Ambulatory Visit (INDEPENDENT_AMBULATORY_CARE_PROVIDER_SITE_OTHER)
Admission: RE | Admit: 2019-09-09 | Discharge: 2019-09-09 | Disposition: A | Discharge status: Home / Self Care | Payer: Medicaid Other | Source: Ambulatory Visit

## 2019-09-09 DIAGNOSIS — R21 Rash and other nonspecific skin eruption: Secondary | ICD-10-CM

## 2019-09-09 MED ORDER — PREDNISONE 10 MG PO TABS: ORAL_TABLET | ORAL | 0 refills | Status: DC

## 2019-09-09 MED ORDER — CETIRIZINE HCL 10 MG PO CAPS: 10.0000 mg | ORAL_CAPSULE | Freq: Every day | ORAL | 0 refills | Status: DC

## 2019-09-09 MED ORDER — TRIAMCINOLONE ACETONIDE 0.1 % EX CREA: 1.0000 "application " | TOPICAL_CREAM | Freq: Two times a day (BID) | CUTANEOUS | 0 refills | Status: DC

## 2019-09-09 NOTE — ED Provider Notes (Signed)
Virtual Visit via Video Note:  Natalie Holmes  initiated request for Telemedicine visit with Natalie Holmes. I connected with Natalie Holmes  on 09/09/2019 at 11:17 AM  for a synchronized telemedicine visit using a video enabled HIPPA compliant telemedicine application. I verified that I am speaking with Natalie Holmes  using two identifiers. Natalie Hari C Zahid Carneiro, PA-C  was physically located in a [facility] Urgent care site and Natalie Holmes was located at a different location.   The limitations of evaluation and management by telemedicine as well as the availability of in-person appointments were discussed. Patient was informed that she  may incur a bill ( including co-pay) for this virtual visit encounter. Natalie Holmes  expressed understanding and gave verbal consent to proceed with virtual visit.     History of Present Illness:Natalie Holmes  is a 23 y.o. female presents for evaluation of a rash.  Patient states that she was seen approximately 1 to 2 weeks ago for a rash.  She was placed on prednisone taper x6 days along with mupirocin ointment.  States that her rash almost fully resolved, but after completing steroids rash returned.  Rashes been associated with significant itching.  States that she is darkened areas on various areas of her arms, legs and neck.  Feels similar to when she was previously had asthma.  Notes that after itching she developed small bumps related to these lesions.  Is up-to-date on vaccines, endorses having chickenpox and chickenpox vaccine when she was younger.  Denies close contacts with similar symptoms.  Initially thought could be allergic reaction related to new laundry detergent, but she has switched this and wash linens and symptoms are still persisting.  She denies any fever chills or body aches.  Denies associated nausea vomiting.  Denies headaches.   Allergies  Allergen Reactions  . Penicillins Hives    Did it involve swelling of the face/tongue/throat,  SOB, or low BP? Unknown Did it involve sudden or severe rash/hives, skin peeling, or any reaction on the inside of your mouth or nose? No Did you need to seek medical attention at a hospital or doctor's office? No When did it last happen?2 years ago If all above answers are "NO", may proceed with cephalosporin use.      Past Medical History:  Diagnosis Date  . Pregnancy induced hypertension      Social History   Tobacco Use  . Smoking status: Never Smoker  . Smokeless tobacco: Never Used  Substance Use Topics  . Alcohol use: Not Currently    Alcohol/week: 1.0 standard drinks    Types: 1 Glasses of wine per week    Frequency: Never  . Drug use: Never        Observations/Objective: Physical Exam  Constitutional: She is oriented to person, place, and time and well-developed, well-nourished, and in no distress. No distress.  HENT:  Head: Normocephalic and atraumatic.  Pulmonary/Chest: Effort normal. No respiratory distress.  Speaking in full sentences  Musculoskeletal:     Comments: Moving all extremities appropriately  Neurological: She is alert and oriented to person, place, and time.  Speech clear, face symmetric  Skin:  Various areas with hyperpigmentation with central whitish color, patient denies lesions being raised, no erythema noted, lesions noted to bilateral thighs, upper extremities and axilla     Assessment and Plan:    ICD-10-CM   1. Rash and nonspecific skin eruption  R21        Follow Up Instructions: Rash previously  responsive to steroids.  Does not seem infectious as without any pain or redness.  Given quick progression, feel this is less likely fungal.  Most likely inflammatory/allergic, but detailed visualization limited due to video quality.  Will place on extended course of steroids x12 days taper.  Also providing topical steroids to use.  Advised if rash does not fully resolve or returns again to follow-up in person for further evaluation  and better visualization of rash.  Unclear specific trigger at this time.  Discussed strict return precautions. Patient verbalized understanding and is agreeable with plan.     I discussed the assessment and treatment plan with the patient. The patient was provided an opportunity to ask questions and all were answered. The patient agreed with the plan and demonstrated an understanding of the instructions.   The patient was advised to call back or seek an in-person evaluation if the symptoms worsen or if the condition fails to improve as anticipated.      Janith Lima, PA-C  09/09/2019 11:17 AM         Janith Lima, PA-C 09/09/19 1128

## 2019-09-09 NOTE — Discharge Instructions (Signed)
Prednisone taper x 12 days Topical triamcinolone cream twice daily Cetirizine daily for itching  Follow up in person if rash changing or not resolving

## 2019-09-28 ENCOUNTER — Ambulatory Visit
Admission: EM | Admit: 2019-09-28 | Discharge: 2019-09-28 | Disposition: A | Discharge status: Home / Self Care | Payer: Medicaid Other | Attending: Physician Assistant | Admitting: Physician Assistant

## 2019-09-28 DIAGNOSIS — B86 Scabies: Secondary | ICD-10-CM

## 2019-09-28 DIAGNOSIS — R21 Rash and other nonspecific skin eruption: Secondary | ICD-10-CM | POA: Diagnosis not present

## 2019-09-28 MED ORDER — SARNA 0.5-0.5 % EX LOTN: 1.0000 "application " | TOPICAL_LOTION | CUTANEOUS | 0 refills | Status: DC | PRN

## 2019-09-28 MED ORDER — PERMETHRIN 5 % EX CREA: TOPICAL_CREAM | CUTANEOUS | 1 refills | Status: DC

## 2019-09-28 MED ORDER — PREDNISONE 20 MG PO TABS: 20.0000 mg | ORAL_TABLET | Freq: Every day | ORAL | 0 refills | Status: AC

## 2019-09-28 NOTE — ED Provider Notes (Signed)
[name]    CSN: [id number] Arrival date & time: 09/28/19  1233      History   Chief Complaint Chief Complaint  Patient presents with  . Recurrent Skin Infections    HPI Natalie Holmes is a 23 y.o. female.   23 year old female returns to clinic for recurrent skin rash/irritation.  Symptoms for started in October, with itching and rash to the groin, bilateral axilla.  She was put on prednisone with good relief of itching.  However, itching returned after finishing course of prednisone.  She was then seen 09/09/2019, and was put on a longer taper course of prednisone, triamcinolone, Zyrtec.  Again, while on course of prednisone, itching significantly improved, but rash continued.  Itching is worse at nighttime.  Now, itching and rash along bilateral flexor arm, fingers.  Denies fever, chills, body aches.  When symptoms first started, she has had changes in hygiene product.  She has since either switched products, or has chosen nonfrequent versions.  Partner now with similar symptoms.  States has had friends staying over when symptoms first started.  Denies traveling.     Past Medical History:  Diagnosis Date  . Pregnancy induced hypertension     Patient Active Problem List   Diagnosis Date Noted  . Follow up 06/04/2019  . Breakthrough bleeding on Nexplanon 05/07/2019  . Postpartum anxiety 05/07/2019  . Postpartum care and examination 01/01/2019  . Nexplanon in place 01/01/2019  . Gestational hypertension 11/29/2018  . Penicillin allergy 11/11/2018  . Encounter for supervision of normal first pregnancy in third trimester 08/18/2018    History reviewed. No pertinent surgical history.  OB History    Gravida  1   Para  1   Term  1   Preterm      AB      Living  1     SAB      TAB      Ectopic      Multiple      Live Births  1            Home Medications    Prior to Admission medications   Medication Sig Start Date End Date Taking?  Authorizing Provider  camphor-menthol Wynelle Fanny) lotion Apply 1 application topically as needed for itching. 09/28/19   Cathie Hoops, Amy V, PA-C  Cetirizine HCl 10 MG CAPS Take 1 capsule (10 mg total) by mouth daily for 10 days. 09/09/19 09/19/19  Wieters, Hallie C, PA-C  permethrin (ELIMITE) 5 % cream Thoroughly massage cream from head to soles of feet; leave on for 8 to 14 hours before removing (shower or bath); may repeat if living mites are observed 14 days after first treatment 09/28/19   Cathie Hoops, Amy V, PA-C  predniSONE (DELTASONE) 20 MG tablet Take 1 tablet (20 mg total) by mouth daily with breakfast for 5 days. 09/28/19 10/03/19  Cathie Hoops, Amy V, PA-C  triamcinolone cream (KENALOG) 0.1 % Apply 1 application topically 2 (two) times daily. 09/09/19   Wieters, Hallie C, PA-C  amLODipine (NORVASC) 5 MG tablet Take 1 tablet (5 mg total) by mouth daily. Patient not taking: Reported on 06/04/2019 12/07/18 08/30/19  Rolm Bookbinder, CNM  ferrous sulfate 325 (65 FE) MG tablet Take 1 tablet (325 mg total) by mouth daily. ferrous sulfate 325 mg (65 mg iron) tablet  Take 1 tablet twice a day by oral route for 30 days. Patient not taking: Reported on 01/01/2019 09/15/18 08/30/19  Gwenevere Abbot, MD  norgestimate-ethinyl estradiol (ORTHO-CYCLEN)  0.25-35 MG-MCG tablet Take 1 tablet by mouth daily. 05/07/19 08/30/19  Rasch, Harolyn RutherfordJennifer I, NP    Family History Family History  Problem Relation Age of Onset  . Healthy Mother   . Healthy Father     Social History Social History   Tobacco Use  . Smoking status: Never Smoker  . Smokeless tobacco: Never Used  Substance Use Topics  . Alcohol use: Not Currently    Alcohol/week: 1.0 standard drinks    Types: 1 Glasses of wine per week    Frequency: Never  . Drug use: Never     Allergies   Penicillins   Review of Systems Review of Systems  Reason unable to perform ROS: See HPI as above.     Physical Exam Triage Vital Signs ED Triage Vitals  Enc Vitals Group     BP  09/28/19 1242 123/84     Pulse Rate 09/28/19 1242 (!) 110     Resp 09/28/19 1242 16     Temp 09/28/19 1242 99.3 F (37.4 C)     Temp Source 09/28/19 1242 Oral     SpO2 09/28/19 1242 96 %     Weight --      Height --      Head Circumference --      Peak Flow --      Pain Score 09/28/19 1243 0     Pain Loc --      Pain Edu? --      Excl. in GC? --    No data found.  Updated Vital Signs BP 123/84 (BP Location: Left Arm)   Pulse (!) 110   Temp 99.3 F (37.4 C) (Oral)   Resp 16   SpO2 96%   Physical Exam Constitutional:      General: She is not in acute distress.    Appearance: She is well-developed. She is not diaphoretic.  HENT:     Head: Normocephalic and atraumatic.  Eyes:     Conjunctiva/sclera: Conjunctivae normal.     Pupils: Pupils are equal, round, and reactive to light.  Pulmonary:     Effort: Pulmonary effort is normal. No respiratory distress.  Skin:    General: Skin is warm and dry.     Comments: Maculopapular rash along bilateral flexor surface of upper and lower arm/elbow with few excoriations.  Few rashes seen between finger webspace.  No burrow marks.  Neurological:     Mental Status: She is alert and oriented to person, place, and time.      UC Treatments / Results  Labs (all labs ordered are listed, but only abnormal results are displayed) Labs Reviewed - No data to display  EKG   Radiology No results found.  Procedures Procedures (including critical care time)  Medications Ordered in UC Medications - No data to display  Initial Impression / Assessment and Plan / UC Course  I have reviewed the triage vital signs and the nursing notes.  Pertinent labs & imaging results that were available during my care of the patient were reviewed by me and considered in my medical decision making (see chart for details).    Given symptoms recurring despite prednisone course, will cover for scabies.  Will provide Sarna lotion and continue  triamcinolone cream for symptomatic relief.  Discussed possibility of increased itching while using permethrin.  However, would like to defer oral prednisone at this time given recent 2 courses of prednisone.  Will provide low-dose prednisone 20 mg daily for 5 days  if needed.  Return precautions given.  Patient expresses understanding and agrees to plan.  Final Clinical Impressions(s) / UC Diagnoses   Final diagnoses:  Rash    ED Prescriptions    Medication Sig Dispense Auth. Provider   permethrin (ELIMITE) 5 % cream Thoroughly massage cream from head to soles of feet; leave on for 8 to 14 hours before removing (shower or bath); may repeat if living mites are observed 14 days after first treatment 60 g Yu, Amy V, PA-C   predniSONE (DELTASONE) 20 MG tablet Take 1 tablet (20 mg total) by mouth daily with breakfast for 5 days. 5 tablet Yu, Amy V, PA-C   camphor-menthol [facility]) lotion Apply 1 application topically as needed for itching. 222 mL Ok Edwards, PA-C     PDMP not reviewed this encounter.   Ok Edwards, PA-C 09/28/19 1334

## 2019-09-28 NOTE — Discharge Instructions (Addendum)
Start permethrin as directed for possible scabies. You can use SARNA lottion and triamcinolone cream for now to control itching. If needed, start prednisone as directed. If symptoms still not improving, follow up with dermatology for further evaluation needed.

## 2019-09-28 NOTE — ED Triage Notes (Signed)
Pt c/o recurring skin rash for 38months. States tx'd with prednisone and cream for 2 rounds. States rash is back to arms, elbow, hands and finger.

## 2019-12-03 ENCOUNTER — Other Ambulatory Visit: Payer: Self-pay

## 2019-12-03 ENCOUNTER — Ambulatory Visit
Admission: EM | Admit: 2019-12-03 | Discharge: 2019-12-03 | Disposition: A | Discharge status: Home / Self Care | Payer: Medicaid Other | Attending: Physician Assistant | Admitting: Physician Assistant

## 2019-12-03 DIAGNOSIS — R0981 Nasal congestion: Secondary | ICD-10-CM | POA: Diagnosis not present

## 2019-12-03 DIAGNOSIS — J3489 Other specified disorders of nose and nasal sinuses: Secondary | ICD-10-CM

## 2019-12-03 DIAGNOSIS — J029 Acute pharyngitis, unspecified: Secondary | ICD-10-CM

## 2019-12-03 MED ORDER — CETIRIZINE HCL 10 MG PO TABS: 10.0000 mg | ORAL_TABLET | Freq: Every day | ORAL | 0 refills | Status: DC

## 2019-12-03 MED ORDER — FLUTICASONE PROPIONATE 50 MCG/ACT NA SUSP: 2.0000 | Freq: Every day | NASAL | 0 refills | Status: DC

## 2019-12-03 NOTE — Discharge Instructions (Signed)
As discussed, cannot rule out COVID causing symptoms. You have chose not to be tested today, please quarantine until your symptoms improve. Start zyrtec, flonase as directed. Supplement benadryl at night if needed. If experiencing shortness of breath, trouble breathing, go to the emergency department for further evaluation needed.   If you decide to get COVID testing, you can go through drive through appointments :  To make appointment: You can text "COVID" to [phone] OR log on to [email or web address] If no smart phone or PC, you can call [phone] for assistance in setting up appointments.  COVID drive through sites:  [place]: [address] [place]: [address] [place]: [address]

## 2019-12-03 NOTE — ED Provider Notes (Signed)
[name]    CSN: [id number] Arrival date & time: 12/03/19  0814      History   Chief Complaint Chief Complaint  Patient presents with   Sore Throat    HPI Natalie Holmes is a 24 y.o. female.   24 year old female comes in for 2 day of URI symptoms. Has had sore throat, rhinorrhea, ear itching, sneezing. Denies cough. Denies fever, chills, body aches. Denies abdominal pain, nausea, vomiting, diarrhea. Denies shortness of breath, loss of taste/smell. Never smoker.      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 No pertinent surgical history.  OB History    Gravida  1   Para  1   Term  1   Preterm      AB      Living  1     SAB      TAB      Ectopic      Multiple      Live Births  1            Home Medications    Prior to Admission medications   Medication Sig Start Date End Date Taking? Authorizing Provider  cetirizine (ZYRTEC) 10 MG tablet Take 1 tablet (10 mg total) by mouth daily. 12/03/19   Tasia Catchings, Cristhian Vanhook V, PA-C  fluticasone (FLONASE) 50 MCG/ACT nasal spray Place 2 sprays into both nostrils daily. 12/03/19   Tasia Catchings, Jeovanny Cuadros V, PA-C  amLODipine (NORVASC) 5 MG tablet Take 1 tablet (5 mg total) by mouth daily. Patient not taking: Reported on 06/04/2019 12/07/18 08/30/19  Wende Mott, CNM  ferrous sulfate 325 (65 FE) MG tablet Take 1 tablet (325 mg total) by mouth daily. ferrous sulfate 325 mg (65 mg iron) tablet  Take 1 tablet twice a day by oral route for 30 days. Patient not taking: Reported on  01/01/2019 09/15/18 08/30/19  Aura Camps, MD  norgestimate-ethinyl estradiol (ORTHO-CYCLEN) 0.25-35 MG-MCG tablet Take 1 tablet by mouth daily. 05/07/19 08/30/19  Rasch, Artist Pais, NP    Family History Family History  Problem Relation Age of Onset   Healthy Mother    Healthy Father     Social History Social History   Tobacco Use   Smoking status: Never Smoker   Smokeless tobacco: Never Used  Substance Use Topics   Alcohol use: Not Currently    Alcohol/week: 1.0 standard drinks    Types: 1 Glasses of wine per week   Drug use: Never     Allergies   Penicillins   Review of Systems Review of Systems   Physical Exam Triage Vital Signs ED Triage Vitals [12/03/19 0821]  Enc Vitals Group     BP 114/78     Pulse Rate 98     Resp 16     Temp 98.4 F (36.9 C)     Temp Source Oral     SpO2 98 %     Weight      Height      Head Circumference      Peak Flow  Pain Score 0     Pain Loc      Pain Edu?      Excl. in GC?    No data found.  Updated Vital Signs BP 114/78 (BP Location: Left Arm)    Pulse 98    Temp 98.4 F (36.9 C) (Oral)    Resp 16    SpO2 98%   Physical Exam Constitutional:      General: She is not in acute distress.    Appearance: Normal appearance. She is not ill-appearing, toxic-appearing or diaphoretic.  HENT:     Head: Normocephalic and atraumatic.     Right Ear: Tympanic membrane, ear canal and external ear normal. Tympanic membrane is not erythematous or bulging.     Left Ear: Tympanic membrane, ear canal and external ear normal. Tympanic membrane is not erythematous or bulging.     Nose: Congestion present.     Right Sinus: No maxillary sinus tenderness or frontal sinus tenderness.     Left Sinus: No maxillary sinus tenderness or frontal sinus tenderness.     Mouth/Throat:     Mouth: Mucous membranes are moist.     Pharynx: Oropharynx is clear. Uvula midline.  Cardiovascular:     Rate and Rhythm: Normal rate and regular  rhythm.     Heart sounds: Normal heart sounds. No murmur. No friction rub. No gallop.   Pulmonary:     Effort: Pulmonary effort is normal. No accessory muscle usage, prolonged expiration, respiratory distress or retractions.     Comments: Lungs clear to auscultation without adventitious lung sounds. Musculoskeletal:     Cervical back: Normal range of motion and neck supple.  Skin:    General: Skin is warm and dry.  Neurological:     General: No focal deficit present.     Mental Status: She is alert and oriented to person, place, and time.      UC Treatments / Results  Labs (all labs ordered are listed, but only abnormal results are displayed) Labs Reviewed - No data to display  EKG   Radiology No results found.  Procedures Procedures (including critical care time)  Medications Ordered in UC Medications - No data to display  Initial Impression / Assessment and Plan / UC Course  I have reviewed the triage vital signs and the nursing notes.  Pertinent labs & imaging results that were available during my care of the patient were reviewed by me and considered in my medical decision making (see chart for details).    Discussed cannot rule out Covid as cause of symptoms, patient declined testing at this time.  Encouraged quarantine until symptoms resolve.  Symptomatic treatment discussed.  Push fluids.  Return precautions given.  Patient expresses understanding and agrees to plan.  Final Clinical Impressions(s) / UC Diagnoses   Final diagnoses:  Rhinorrhea  Nasal congestion  Sore throat    ED Prescriptions    Medication Sig Dispense Auth. Provider   cetirizine (ZYRTEC) 10 MG tablet Take 1 tablet (10 mg total) by mouth daily. 30 tablet Quinn Bartling V, PA-C   fluticasone (FLONASE) 50 MCG/ACT nasal spray Place 2 sprays into both nostrils daily. 1 g Belinda Fisher, PA-C     PDMP not reviewed this encounter.   Belinda Fisher, PA-C 12/03/19 [phone]

## 2019-12-03 NOTE — ED Triage Notes (Signed)
Pt states hx of allergies to dust and dander, c/o sore throat, runny nose, and ear burning/itching x2 days. Took benadryl x3 in the past 2 days with no relief.

## 2019-12-17 ENCOUNTER — Encounter: Payer: Self-pay | Admitting: Family Medicine

## 2019-12-17 ENCOUNTER — Encounter (INDEPENDENT_AMBULATORY_CARE_PROVIDER_SITE_OTHER): Payer: Medicaid Other | Admitting: Obstetrics and Gynecology

## 2020-03-16 ENCOUNTER — Encounter: Payer: Self-pay | Admitting: Internal Medicine

## 2020-03-16 ENCOUNTER — Telehealth (INDEPENDENT_AMBULATORY_CARE_PROVIDER_SITE_OTHER): Payer: Medicaid Other | Admitting: Internal Medicine

## 2020-03-16 DIAGNOSIS — Z7689 Persons encountering health services in other specified circumstances: Secondary | ICD-10-CM | POA: Diagnosis not present

## 2020-03-16 DIAGNOSIS — R21 Rash and other nonspecific skin eruption: Secondary | ICD-10-CM | POA: Diagnosis not present

## 2020-03-16 MED ORDER — TRIAMCINOLONE ACETONIDE 0.1 % EX CREA: 1.0000 "application " | TOPICAL_CREAM | Freq: Two times a day (BID) | CUTANEOUS | 1 refills | Status: AC

## 2020-03-16 NOTE — Progress Notes (Signed)
Virtual Visit via Telephone Note  I connected with Natalie Holmes, on 03/16/2020 at 10:37 AM by telephone due to the COVID-19 pandemic and verified that I am speaking with the correct person using two identifiers.   Consent: I discussed the limitations, risks, security and privacy concerns of performing an evaluation and management service by telephone and the availability of in person appointments. I also discussed with the patient that there may be a patient responsible charge related to this service. The patient expressed understanding and agreed to proceed.   Location of Patient: Home   Location of Provider: Clinic    Persons participating in Telemedicine visit: Inge Waldroup [facility] Dr. Juleen China      History of Present Illness: Patient has a visit to establish care. Patient reports that she had a history of eczema and bronchitis as a child. She is G1P1 with history of gHTN. She reports that her BP normalized after delivery of her child.   She reports that she was seen for rash in November of last year for rash x2. She was treated for scabies at that time. She reports rash really never resolved and is present on her left inner thigh and in the elbow creases. Endorses pruritis. Reports skin is dry and flaky. Appears to be like a patch of raised skin and is becoming more hyperpigmented.    Past Medical History:  Diagnosis Date  . Pregnancy induced hypertension    Allergies  Allergen Reactions  . Penicillins Hives    Did it involve swelling of the face/tongue/throat, SOB, or low BP? Unknown Did it involve sudden or severe rash/hives, skin peeling, or any reaction on the inside of your mouth or nose? No Did you need to seek medical attention at a hospital or doctor's office? No When did it last happen?2 years ago If all above answers are "NO", may proceed with cephalosporin use.     Current Outpatient Medications on File Prior to Visit  Medication Sig Dispense  Refill  . [DISCONTINUED] amLODipine (NORVASC) 5 MG tablet Take 1 tablet (5 mg total) by mouth daily. (Patient not taking: Reported on 06/04/2019) 30 tablet 0  . [DISCONTINUED] ferrous sulfate 325 (65 FE) MG tablet Take 1 tablet (325 mg total) by mouth daily. ferrous sulfate 325 mg (65 mg iron) tablet  Take 1 tablet twice a day by oral route for 30 days. (Patient not taking: Reported on 01/01/2019) 90 tablet 1  . [DISCONTINUED] norgestimate-ethinyl estradiol (ORTHO-CYCLEN) 0.25-35 MG-MCG tablet Take 1 tablet by mouth daily. 1 Package 0   No current facility-administered medications on file prior to visit.    Observations/Objective: NAD. Speaking clearly.  Work of breathing normal.  Alert and oriented. Mood appropriate.   Assessment and Plan: 1. Encounter to establish care Reviewed patient's PMH, social history, surgical history, and medications.  Up to date on PAP.   2. Rash and nonspecific skin eruption Sounds most consistent with eczema given her childhood history, location of rash, symptoms, and chronicity. Will treat with steroid cream. Discussed appropriate use of medication. Discussed importance of a good emollient.  - triamcinolone cream (KENALOG) 0.1 %; Apply 1 application topically 2 (two) times daily.  Dispense: 80 g; Refill: 1   Follow Up Instructions: Annual exam or PRN    I discussed the assessment and treatment plan with the patient. The patient was provided an opportunity to ask questions and all were answered. The patient agreed with the plan and demonstrated an understanding of the instructions.   The  patient was advised to call back or seek an in-person evaluation if the symptoms worsen or if the condition fails to improve as anticipated.     I provided 12 minutes total of non-face-to-face time during this encounter including median intraservice time, reviewing previous notes, investigations, ordering medications, medical decision making, coordinating care and patient  verbalized understanding at the end of the visit.    Marcy Siren, D.O. Primary Care at [facility]  03/16/2020, 10:37 AM

## 2020-04-19 ENCOUNTER — Encounter: Payer: Self-pay | Admitting: Family Medicine

## 2020-04-19 ENCOUNTER — Ambulatory Visit (INDEPENDENT_AMBULATORY_CARE_PROVIDER_SITE_OTHER): Payer: Medicaid Other | Admitting: Family Medicine

## 2020-04-19 VITALS — BP 114/82 | HR 103 | Wt 143.0 lb

## 2020-04-19 DIAGNOSIS — Z3046 Encounter for surveillance of implantable subdermal contraceptive: Secondary | ICD-10-CM | POA: Diagnosis not present

## 2020-04-19 NOTE — Patient Instructions (Signed)
Hormonal Contraception Information Hormonal contraception is a type of birth control that uses hormones to prevent pregnancy. It usually involves a combination of the hormones estrogen and progesterone or only the hormone progesterone. Hormonal contraception works in these ways:  It thickens the mucus in the cervix, making it harder for sperm to enter the uterus.  It changes the lining of the uterus, making it harder for an egg to implant.  It may stop the ovaries from releasing eggs (ovulation). Some women who take hormonal contraceptives that contain only progesterone may continue to ovulate. Hormonal contraception cannot prevent sexually transmitted infections (STIs). Pregnancy may still occur. Estrogen and progesterone contraceptives Contraceptives that use a combination of estrogen and progesterone are available in these forms:  Pill. Pills come in different combinations of hormones. They must be taken at the same time each day. Pills can affect your period, causing you to get your period once every three months or not at all.  Patch. The patch must be worn on the lower abdomen for three weeks and then removed on the fourth.  Vaginal ring. The ring is placed in the vagina and left there for three weeks. It is then removed for one week. Progesterone contraceptives Contraceptives that use progesterone only are available in these forms:  Pill. Pills should be taken every day of the cycle.  Intrauterine device (IUD). This device is inserted into the uterus and removed or replaced every five years or sooner.  Implant. Plastic rods are placed under the skin of the upper arm. They are removed or replaced every three years or sooner.  Injection. The injection is given once every 90 days. What are the side effects? The side effects of estrogen and progesterone contraceptives include:  Nausea.  Headaches.  Breast tenderness.  Bleeding or spotting between menstrual cycles.  High blood  pressure (rare).  Strokes, heart attacks, or blood clots (rare) Side effects of progesterone-only contraceptives include:  Nausea.  Headaches.  Breast tenderness.  Unpredictable menstrual bleeding.  High blood pressure (rare). Talk to your health care provider about what side effects may affect you. Where to find more information  Ask your health care provider for more information and resources about hormonal contraception.  [place] Department of Health and Human Services Office on Women's Health: www.womenshealth.gov Questions to ask:  What type of hormonal contraception is right for me?  How long should I plan to use hormonal contraception?  What are the side effects of the hormonal contraception method I choose?  How can I prevent STIs while using hormonal contraception? Contact a health care provider if:  You start taking hormonal contraceptives and you develop persistent or severe side effects. Summary  Estrogen and progesterone are hormones used in many forms of birth control.  Talk to your health care provider about what side effects may affect you.  Hormonal contraception cannot prevent sexually transmitted infections (STIs).  Ask your health care provider for more information and resources about hormonal contraception. This information is not intended to replace advice given to you by your health care provider. Make sure you discuss any questions you have with your health care provider. Document Revised: 02/16/2019 Document Reviewed: 09/21/2016 Elsevier Patient Education  2020 Elsevier Inc.  

## 2020-04-19 NOTE — Progress Notes (Signed)
Nexplanon Removal Procedure Note  Patient given informed consent for removal of her Nexplanon due to breakthrough bleeding. Time out was performed and signed copy scanning into the chart. Nexplanon site identified on left arm. Area prepped in usual sterile fashon. One cc of 1% lidocaine was used to anesthetize the area at the distal end of the implant. A small stab incision was made w/ an 11 blade scalpel at the distal portion end. The Nexplanon rod was grasped using hemostats and removed without difficulty. Removal of entire 40 mm rod confirmed and shown to patient. There was less than 3 cc blood loss. There were no complications. A pressure bandage was applied to reduce any bruising. The patient tolerated the procedure well and was given post procedure instructions. Patient declines birth control at this time.  Tarrin Menn L, DO 04/19/2020 9:43 AM

## 2020-04-19 NOTE — Progress Notes (Signed)
Here for Nexplanon removal states she docents want anything in place she would like to wait and let her cycle regulate first.
